# Patient Record
Sex: Male | Born: 1960 | Race: Black or African American | Hispanic: No | Smoking: Current every day smoker
Health system: Southern US, Community
[De-identification: ages and names within clinical notes are randomized; demographics above are authoritative.]

## PROBLEM LIST (undated history)

## (undated) DIAGNOSIS — N400 Enlarged prostate without lower urinary tract symptoms: Secondary | ICD-10-CM

## (undated) DIAGNOSIS — E7800 Pure hypercholesterolemia, unspecified: Secondary | ICD-10-CM

## (undated) DIAGNOSIS — I10 Essential (primary) hypertension: Secondary | ICD-10-CM

## (undated) DIAGNOSIS — N419 Inflammatory disease of prostate, unspecified: Secondary | ICD-10-CM

## (undated) DIAGNOSIS — M549 Dorsalgia, unspecified: Secondary | ICD-10-CM

## (undated) DIAGNOSIS — G4730 Sleep apnea, unspecified: Secondary | ICD-10-CM

## (undated) DIAGNOSIS — R911 Solitary pulmonary nodule: Secondary | ICD-10-CM

## (undated) DIAGNOSIS — Z8619 Personal history of other infectious and parasitic diseases: Secondary | ICD-10-CM

## (undated) DIAGNOSIS — G8929 Other chronic pain: Secondary | ICD-10-CM

## (undated) HISTORY — DX: Essential (primary) hypertension: I10

## (undated) HISTORY — PX: BUNIONECTOMY: SHX129

## (undated) HISTORY — PX: TESTICLE REMOVAL: SHX68

---

## 2003-05-26 ENCOUNTER — Emergency Department (HOSPITAL_COMMUNITY): Admission: EM | Admit: 2003-05-26 | Discharge: 2003-05-26 | Payer: Self-pay | Admitting: *Deleted

## 2003-06-02 ENCOUNTER — Emergency Department (HOSPITAL_COMMUNITY): Admission: EM | Admit: 2003-06-02 | Discharge: 2003-06-02 | Payer: Self-pay | Admitting: Emergency Medicine

## 2003-06-05 ENCOUNTER — Emergency Department (HOSPITAL_COMMUNITY): Admission: EM | Admit: 2003-06-05 | Discharge: 2003-06-06 | Payer: Self-pay | Admitting: Emergency Medicine

## 2003-07-03 ENCOUNTER — Emergency Department (HOSPITAL_COMMUNITY): Admission: EM | Admit: 2003-07-03 | Discharge: 2003-07-04 | Payer: Self-pay | Admitting: Emergency Medicine

## 2003-08-13 ENCOUNTER — Emergency Department (HOSPITAL_COMMUNITY): Admission: AD | Admit: 2003-08-13 | Discharge: 2003-08-13 | Payer: Self-pay | Admitting: Emergency Medicine

## 2003-09-07 ENCOUNTER — Emergency Department (HOSPITAL_COMMUNITY): Admission: EM | Admit: 2003-09-07 | Discharge: 2003-09-07 | Payer: Self-pay | Admitting: *Deleted

## 2003-09-19 ENCOUNTER — Emergency Department (HOSPITAL_COMMUNITY): Admission: AD | Admit: 2003-09-19 | Discharge: 2003-09-19 | Payer: Self-pay | Admitting: Family Medicine

## 2003-10-09 ENCOUNTER — Emergency Department (HOSPITAL_COMMUNITY): Admission: EM | Admit: 2003-10-09 | Discharge: 2003-10-09 | Payer: Self-pay | Admitting: Emergency Medicine

## 2003-10-12 ENCOUNTER — Emergency Department (HOSPITAL_COMMUNITY): Admission: EM | Admit: 2003-10-12 | Discharge: 2003-10-12 | Payer: Self-pay | Admitting: Emergency Medicine

## 2003-10-22 ENCOUNTER — Emergency Department (HOSPITAL_COMMUNITY): Admission: EM | Admit: 2003-10-22 | Discharge: 2003-10-22 | Payer: Self-pay | Admitting: Emergency Medicine

## 2003-10-24 ENCOUNTER — Encounter: Admission: RE | Admit: 2003-10-24 | Discharge: 2003-10-24 | Payer: Self-pay | Admitting: Internal Medicine

## 2004-01-26 ENCOUNTER — Emergency Department (HOSPITAL_COMMUNITY): Admission: EM | Admit: 2004-01-26 | Discharge: 2004-01-27 | Payer: Self-pay | Admitting: Emergency Medicine

## 2005-04-21 ENCOUNTER — Emergency Department (HOSPITAL_COMMUNITY): Admission: EM | Admit: 2005-04-21 | Discharge: 2005-04-21 | Payer: Self-pay | Admitting: Emergency Medicine

## 2005-06-17 ENCOUNTER — Emergency Department (HOSPITAL_COMMUNITY): Admission: EM | Admit: 2005-06-17 | Discharge: 2005-06-17 | Payer: Self-pay | Admitting: Emergency Medicine

## 2005-07-10 ENCOUNTER — Emergency Department (HOSPITAL_COMMUNITY): Admission: EM | Admit: 2005-07-10 | Discharge: 2005-07-11 | Payer: Self-pay | Admitting: Emergency Medicine

## 2005-08-14 ENCOUNTER — Emergency Department (HOSPITAL_COMMUNITY): Admission: EM | Admit: 2005-08-14 | Discharge: 2005-08-14 | Payer: Self-pay | Admitting: Emergency Medicine

## 2006-02-22 ENCOUNTER — Emergency Department (HOSPITAL_COMMUNITY): Admission: EM | Admit: 2006-02-22 | Discharge: 2006-02-22 | Payer: Self-pay | Admitting: Emergency Medicine

## 2006-05-03 ENCOUNTER — Emergency Department (HOSPITAL_COMMUNITY): Admission: EM | Admit: 2006-05-03 | Discharge: 2006-05-03 | Payer: Self-pay | Admitting: Emergency Medicine

## 2006-05-19 ENCOUNTER — Emergency Department (HOSPITAL_COMMUNITY): Admission: EM | Admit: 2006-05-19 | Discharge: 2006-05-19 | Payer: Self-pay | Admitting: Emergency Medicine

## 2006-10-13 ENCOUNTER — Emergency Department (HOSPITAL_COMMUNITY): Admission: EM | Admit: 2006-10-13 | Discharge: 2006-10-13 | Payer: Self-pay | Admitting: Emergency Medicine

## 2006-12-15 ENCOUNTER — Emergency Department (HOSPITAL_COMMUNITY): Admission: EM | Admit: 2006-12-15 | Discharge: 2006-12-15 | Payer: Self-pay | Admitting: Emergency Medicine

## 2006-12-15 ENCOUNTER — Encounter (INDEPENDENT_AMBULATORY_CARE_PROVIDER_SITE_OTHER): Payer: Self-pay | Admitting: *Deleted

## 2006-12-15 ENCOUNTER — Ambulatory Visit (HOSPITAL_COMMUNITY): Admission: RE | Admit: 2006-12-15 | Discharge: 2006-12-15 | Payer: Self-pay | Admitting: Emergency Medicine

## 2007-02-03 ENCOUNTER — Emergency Department (HOSPITAL_COMMUNITY): Admission: EM | Admit: 2007-02-03 | Discharge: 2007-02-03 | Payer: Self-pay | Admitting: Emergency Medicine

## 2007-02-03 ENCOUNTER — Encounter: Payer: Self-pay | Admitting: Emergency Medicine

## 2007-04-15 ENCOUNTER — Emergency Department (HOSPITAL_COMMUNITY): Admission: EM | Admit: 2007-04-15 | Discharge: 2007-04-15 | Payer: Self-pay | Admitting: Emergency Medicine

## 2007-07-25 ENCOUNTER — Emergency Department (HOSPITAL_COMMUNITY): Admission: EM | Admit: 2007-07-25 | Discharge: 2007-07-25 | Payer: Self-pay | Admitting: Emergency Medicine

## 2007-08-26 ENCOUNTER — Emergency Department (HOSPITAL_COMMUNITY): Admission: EM | Admit: 2007-08-26 | Discharge: 2007-08-26 | Payer: Self-pay | Admitting: Emergency Medicine

## 2007-09-20 ENCOUNTER — Encounter (INDEPENDENT_AMBULATORY_CARE_PROVIDER_SITE_OTHER): Payer: Self-pay | Admitting: Nurse Practitioner

## 2007-09-24 ENCOUNTER — Ambulatory Visit: Payer: Self-pay | Admitting: Nurse Practitioner

## 2007-09-24 DIAGNOSIS — M21619 Bunion of unspecified foot: Secondary | ICD-10-CM | POA: Insufficient documentation

## 2007-09-24 DIAGNOSIS — K029 Dental caries, unspecified: Secondary | ICD-10-CM | POA: Insufficient documentation

## 2007-09-24 LAB — CONVERTED CEMR LAB
ALT: 22 units/L (ref 0–53)
AST: 16 units/L (ref 0–37)
Albumin: 4.1 g/dL (ref 3.5–5.2)
Alkaline Phosphatase: 62 units/L (ref 39–117)
BUN: 15 mg/dL (ref 6–23)
Basophils Absolute: 0 10*3/uL (ref 0.0–0.1)
Basophils Relative: 0 % (ref 0–1)
Bilirubin Urine: NEGATIVE
Blood in Urine, dipstick: NEGATIVE
CO2: 22 meq/L (ref 19–32)
Calcium: 9 mg/dL (ref 8.4–10.5)
Chlamydia, Swab/Urine, PCR: NEGATIVE
Chloride: 107 meq/L (ref 96–112)
Creatinine, Ser: 0.89 mg/dL (ref 0.40–1.50)
Eosinophils Absolute: 0.4 10*3/uL (ref 0.0–0.7)
Eosinophils Relative: 5 % (ref 0–5)
GC Probe Amp, Urine: NEGATIVE
Glucose, Bld: 80 mg/dL (ref 70–99)
Glucose, Urine, Semiquant: NEGATIVE
HCT: 39.6 % (ref 39.0–52.0)
Hemoglobin: 14.4 g/dL (ref 13.0–17.0)
Ketones, urine, test strip: NEGATIVE
Lymphocytes Relative: 30 % (ref 12–46)
Lymphs Abs: 2.3 10*3/uL (ref 0.7–3.3)
MCHC: 36.4 g/dL — ABNORMAL HIGH (ref 30.0–36.0)
MCV: 76.4 fL — ABNORMAL LOW (ref 78.0–100.0)
Monocytes Absolute: 0.9 10*3/uL — ABNORMAL HIGH (ref 0.2–0.7)
Monocytes Relative: 11 % (ref 3–11)
Neutro Abs: 4.1 10*3/uL (ref 1.7–7.7)
Neutrophils Relative %: 54 % (ref 43–77)
Nitrite: NEGATIVE
PSA: 0.51 ng/mL (ref 0.10–4.00)
Platelets: 233 10*3/uL (ref 150–400)
Potassium: 4.2 meq/L (ref 3.5–5.3)
Protein, U semiquant: 30
RBC: 5.18 M/uL (ref 4.22–5.81)
RDW: 14.4 % — ABNORMAL HIGH (ref 11.5–14.0)
Sodium: 141 meq/L (ref 135–145)
Specific Gravity, Urine: 1.02
TSH: 1.011 microintl units/mL (ref 0.350–5.50)
Total Bilirubin: 0.7 mg/dL (ref 0.3–1.2)
Total Protein: 7.3 g/dL (ref 6.0–8.3)
Urobilinogen, UA: 1
WBC Urine, dipstick: NEGATIVE
WBC: 7.7 10*3/uL (ref 4.0–10.5)
pH: 7

## 2007-09-27 ENCOUNTER — Encounter (INDEPENDENT_AMBULATORY_CARE_PROVIDER_SITE_OTHER): Payer: Self-pay | Admitting: Nurse Practitioner

## 2007-09-28 ENCOUNTER — Encounter (INDEPENDENT_AMBULATORY_CARE_PROVIDER_SITE_OTHER): Payer: Self-pay | Admitting: Nurse Practitioner

## 2007-09-29 ENCOUNTER — Ambulatory Visit: Payer: Self-pay | Admitting: *Deleted

## 2007-10-06 ENCOUNTER — Telehealth (INDEPENDENT_AMBULATORY_CARE_PROVIDER_SITE_OTHER): Payer: Self-pay | Admitting: Nurse Practitioner

## 2007-10-14 ENCOUNTER — Encounter (INDEPENDENT_AMBULATORY_CARE_PROVIDER_SITE_OTHER): Payer: Self-pay | Admitting: Nurse Practitioner

## 2007-10-22 ENCOUNTER — Encounter (INDEPENDENT_AMBULATORY_CARE_PROVIDER_SITE_OTHER): Payer: Self-pay | Admitting: Nurse Practitioner

## 2007-10-22 ENCOUNTER — Ambulatory Visit (HOSPITAL_BASED_OUTPATIENT_CLINIC_OR_DEPARTMENT_OTHER): Admission: RE | Admit: 2007-10-22 | Discharge: 2007-10-22 | Payer: Self-pay | Admitting: Urology

## 2007-10-22 ENCOUNTER — Encounter (INDEPENDENT_AMBULATORY_CARE_PROVIDER_SITE_OTHER): Payer: Self-pay | Admitting: Urology

## 2007-10-22 DIAGNOSIS — Z9079 Acquired absence of other genital organ(s): Secondary | ICD-10-CM | POA: Insufficient documentation

## 2007-11-18 ENCOUNTER — Encounter (INDEPENDENT_AMBULATORY_CARE_PROVIDER_SITE_OTHER): Payer: Self-pay | Admitting: Nurse Practitioner

## 2007-12-29 ENCOUNTER — Encounter (INDEPENDENT_AMBULATORY_CARE_PROVIDER_SITE_OTHER): Payer: Self-pay | Admitting: Nurse Practitioner

## 2007-12-29 ENCOUNTER — Ambulatory Visit: Payer: Self-pay | Admitting: Internal Medicine

## 2007-12-31 ENCOUNTER — Ambulatory Visit: Payer: Self-pay | Admitting: Nurse Practitioner

## 2007-12-31 DIAGNOSIS — N489 Disorder of penis, unspecified: Secondary | ICD-10-CM | POA: Insufficient documentation

## 2008-01-10 ENCOUNTER — Telehealth (INDEPENDENT_AMBULATORY_CARE_PROVIDER_SITE_OTHER): Payer: Self-pay | Admitting: Nurse Practitioner

## 2008-01-31 ENCOUNTER — Telehealth (INDEPENDENT_AMBULATORY_CARE_PROVIDER_SITE_OTHER): Payer: Self-pay | Admitting: Nurse Practitioner

## 2008-02-23 ENCOUNTER — Ambulatory Visit: Payer: Self-pay | Admitting: Nurse Practitioner

## 2008-02-28 ENCOUNTER — Telehealth (INDEPENDENT_AMBULATORY_CARE_PROVIDER_SITE_OTHER): Payer: Self-pay | Admitting: Nurse Practitioner

## 2008-03-10 ENCOUNTER — Telehealth (INDEPENDENT_AMBULATORY_CARE_PROVIDER_SITE_OTHER): Payer: Self-pay | Admitting: Nurse Practitioner

## 2008-03-27 ENCOUNTER — Ambulatory Visit: Payer: Self-pay | Admitting: Nurse Practitioner

## 2008-03-27 DIAGNOSIS — K089 Disorder of teeth and supporting structures, unspecified: Secondary | ICD-10-CM | POA: Insufficient documentation

## 2008-03-27 DIAGNOSIS — J309 Allergic rhinitis, unspecified: Secondary | ICD-10-CM | POA: Insufficient documentation

## 2008-04-24 ENCOUNTER — Telehealth (INDEPENDENT_AMBULATORY_CARE_PROVIDER_SITE_OTHER): Payer: Self-pay | Admitting: Nurse Practitioner

## 2008-04-26 ENCOUNTER — Telehealth (INDEPENDENT_AMBULATORY_CARE_PROVIDER_SITE_OTHER): Payer: Self-pay | Admitting: Nurse Practitioner

## 2008-05-08 ENCOUNTER — Encounter (INDEPENDENT_AMBULATORY_CARE_PROVIDER_SITE_OTHER): Payer: Self-pay | Admitting: Nurse Practitioner

## 2008-05-24 ENCOUNTER — Ambulatory Visit: Payer: Self-pay | Admitting: Family Medicine

## 2008-06-21 ENCOUNTER — Ambulatory Visit: Payer: Self-pay | Admitting: Nurse Practitioner

## 2008-06-21 DIAGNOSIS — G5600 Carpal tunnel syndrome, unspecified upper limb: Secondary | ICD-10-CM | POA: Insufficient documentation

## 2008-07-16 ENCOUNTER — Emergency Department (HOSPITAL_COMMUNITY): Admission: EM | Admit: 2008-07-16 | Discharge: 2008-07-16 | Payer: Self-pay | Admitting: Emergency Medicine

## 2008-08-21 ENCOUNTER — Encounter (INDEPENDENT_AMBULATORY_CARE_PROVIDER_SITE_OTHER): Payer: Self-pay | Admitting: Nurse Practitioner

## 2008-08-21 ENCOUNTER — Ambulatory Visit: Payer: Self-pay | Admitting: Internal Medicine

## 2008-09-06 ENCOUNTER — Telehealth (INDEPENDENT_AMBULATORY_CARE_PROVIDER_SITE_OTHER): Payer: Self-pay | Admitting: Nurse Practitioner

## 2008-09-08 ENCOUNTER — Encounter (INDEPENDENT_AMBULATORY_CARE_PROVIDER_SITE_OTHER): Payer: Self-pay | Admitting: Nurse Practitioner

## 2008-09-20 ENCOUNTER — Ambulatory Visit: Payer: Self-pay | Admitting: Nurse Practitioner

## 2008-09-20 DIAGNOSIS — F17200 Nicotine dependence, unspecified, uncomplicated: Secondary | ICD-10-CM | POA: Insufficient documentation

## 2008-09-20 DIAGNOSIS — R300 Dysuria: Secondary | ICD-10-CM | POA: Insufficient documentation

## 2008-09-21 ENCOUNTER — Encounter (INDEPENDENT_AMBULATORY_CARE_PROVIDER_SITE_OTHER): Payer: Self-pay | Admitting: Nurse Practitioner

## 2008-09-22 ENCOUNTER — Encounter (INDEPENDENT_AMBULATORY_CARE_PROVIDER_SITE_OTHER): Payer: Self-pay | Admitting: Nurse Practitioner

## 2008-09-22 ENCOUNTER — Telehealth (INDEPENDENT_AMBULATORY_CARE_PROVIDER_SITE_OTHER): Payer: Self-pay | Admitting: *Deleted

## 2008-09-26 ENCOUNTER — Telehealth (INDEPENDENT_AMBULATORY_CARE_PROVIDER_SITE_OTHER): Payer: Self-pay | Admitting: *Deleted

## 2008-09-26 ENCOUNTER — Telehealth (INDEPENDENT_AMBULATORY_CARE_PROVIDER_SITE_OTHER): Payer: Self-pay | Admitting: Nurse Practitioner

## 2008-10-09 ENCOUNTER — Ambulatory Visit (HOSPITAL_BASED_OUTPATIENT_CLINIC_OR_DEPARTMENT_OTHER): Admission: RE | Admit: 2008-10-09 | Discharge: 2008-10-09 | Payer: Self-pay

## 2008-10-25 ENCOUNTER — Emergency Department (HOSPITAL_COMMUNITY): Admission: EM | Admit: 2008-10-25 | Discharge: 2008-10-25 | Payer: Self-pay | Admitting: Emergency Medicine

## 2009-02-19 ENCOUNTER — Encounter (INDEPENDENT_AMBULATORY_CARE_PROVIDER_SITE_OTHER): Payer: Self-pay | Admitting: Nurse Practitioner

## 2009-02-25 ENCOUNTER — Emergency Department (HOSPITAL_COMMUNITY): Admission: EM | Admit: 2009-02-25 | Discharge: 2009-02-25 | Payer: Self-pay | Admitting: Emergency Medicine

## 2009-02-28 ENCOUNTER — Ambulatory Visit: Payer: Self-pay | Admitting: Nurse Practitioner

## 2009-03-05 ENCOUNTER — Telehealth (INDEPENDENT_AMBULATORY_CARE_PROVIDER_SITE_OTHER): Payer: Self-pay | Admitting: Nurse Practitioner

## 2009-03-23 ENCOUNTER — Ambulatory Visit: Payer: Self-pay | Admitting: Nurse Practitioner

## 2009-03-23 DIAGNOSIS — N342 Other urethritis: Secondary | ICD-10-CM | POA: Insufficient documentation

## 2009-03-28 ENCOUNTER — Telehealth (INDEPENDENT_AMBULATORY_CARE_PROVIDER_SITE_OTHER): Payer: Self-pay | Admitting: Nurse Practitioner

## 2009-05-07 ENCOUNTER — Emergency Department (HOSPITAL_COMMUNITY): Admission: EM | Admit: 2009-05-07 | Discharge: 2009-05-07 | Payer: Self-pay | Admitting: Emergency Medicine

## 2009-05-10 ENCOUNTER — Telehealth (INDEPENDENT_AMBULATORY_CARE_PROVIDER_SITE_OTHER): Payer: Self-pay | Admitting: Nurse Practitioner

## 2009-05-11 ENCOUNTER — Ambulatory Visit: Payer: Self-pay | Admitting: Nurse Practitioner

## 2009-05-11 DIAGNOSIS — M79609 Pain in unspecified limb: Secondary | ICD-10-CM | POA: Insufficient documentation

## 2009-05-14 LAB — CONVERTED CEMR LAB: Uric Acid, Serum: 6.5 mg/dL (ref 4.0–7.8)

## 2009-05-30 ENCOUNTER — Emergency Department (HOSPITAL_COMMUNITY): Admission: EM | Admit: 2009-05-30 | Discharge: 2009-05-30 | Payer: Self-pay | Admitting: Emergency Medicine

## 2009-07-23 ENCOUNTER — Encounter (INDEPENDENT_AMBULATORY_CARE_PROVIDER_SITE_OTHER): Payer: Self-pay | Admitting: Family Medicine

## 2009-08-20 ENCOUNTER — Emergency Department (HOSPITAL_COMMUNITY): Admission: EM | Admit: 2009-08-20 | Discharge: 2009-08-20 | Payer: Self-pay | Admitting: Emergency Medicine

## 2009-08-23 ENCOUNTER — Telehealth (INDEPENDENT_AMBULATORY_CARE_PROVIDER_SITE_OTHER): Payer: Self-pay | Admitting: Nurse Practitioner

## 2009-09-19 ENCOUNTER — Ambulatory Visit: Payer: Self-pay | Admitting: Nurse Practitioner

## 2009-09-19 DIAGNOSIS — R7309 Other abnormal glucose: Secondary | ICD-10-CM | POA: Insufficient documentation

## 2009-09-19 DIAGNOSIS — N400 Enlarged prostate without lower urinary tract symptoms: Secondary | ICD-10-CM | POA: Insufficient documentation

## 2009-09-19 DIAGNOSIS — R3915 Urgency of urination: Secondary | ICD-10-CM | POA: Insufficient documentation

## 2009-09-19 LAB — CONVERTED CEMR LAB
Bilirubin Urine: NEGATIVE
Blood Glucose, Fingerstick: 90
Blood in Urine, dipstick: NEGATIVE
Glucose, Urine, Semiquant: NEGATIVE
Hgb A1c MFr Bld: 6.3 %
Ketones, urine, test strip: NEGATIVE
Nitrite: NEGATIVE
Protein, U semiquant: 100
Rapid HIV Screen: NEGATIVE
Specific Gravity, Urine: >=1.03
Urobilinogen, UA: 0.2
WBC Urine, dipstick: NEGATIVE
pH: 5

## 2009-09-25 DIAGNOSIS — R809 Proteinuria, unspecified: Secondary | ICD-10-CM | POA: Insufficient documentation

## 2009-09-25 LAB — CONVERTED CEMR LAB
ALT: 36 units/L (ref 0–53)
AST: 21 units/L (ref 0–37)
Albumin: 4.2 g/dL (ref 3.5–5.2)
Alkaline Phosphatase: 75 units/L (ref 39–117)
BUN: 18 mg/dL (ref 6–23)
Basophils Absolute: 0 10*3/uL (ref 0.0–0.1)
Basophils Relative: 0 % (ref 0–1)
CO2: 28 meq/L (ref 19–32)
Calcium: 8.8 mg/dL (ref 8.4–10.5)
Chloride: 104 meq/L (ref 96–112)
Creatinine, Ser: 1.14 mg/dL (ref 0.40–1.50)
Eosinophils Absolute: 0.1 10*3/uL (ref 0.0–0.7)
Eosinophils Relative: 1 % (ref 0–5)
Glucose, Bld: 72 mg/dL (ref 70–99)
HCT: 41.3 % (ref 39.0–52.0)
Hemoglobin: 14.4 g/dL (ref 13.0–17.0)
Lymphocytes Relative: 34 % (ref 12–46)
Lymphs Abs: 4.2 10*3/uL — ABNORMAL HIGH (ref 0.7–4.0)
MCHC: 34.9 g/dL (ref 30.0–36.0)
MCV: 77.1 fL — ABNORMAL LOW (ref 78.0–100.0)
Microalb, Ur: 12.73 mg/dL — ABNORMAL HIGH (ref 0.00–1.89)
Monocytes Absolute: 1.4 10*3/uL — ABNORMAL HIGH (ref 0.1–1.0)
Monocytes Relative: 11 % (ref 3–12)
Neutro Abs: 6.5 10*3/uL (ref 1.7–7.7)
Neutrophils Relative %: 53 % (ref 43–77)
PSA: 0.31 ng/mL (ref 0.10–4.00)
Platelets: 297 10*3/uL (ref 150–400)
Potassium: 4 meq/L (ref 3.5–5.3)
RBC: 5.36 M/uL (ref 4.22–5.81)
RDW: 15 % (ref 11.5–15.5)
Sodium: 142 meq/L (ref 135–145)
Total Bilirubin: 0.6 mg/dL (ref 0.3–1.2)
Total Protein: 7.3 g/dL (ref 6.0–8.3)
WBC: 12.2 10*3/uL — ABNORMAL HIGH (ref 4.0–10.5)

## 2009-09-26 ENCOUNTER — Encounter (INDEPENDENT_AMBULATORY_CARE_PROVIDER_SITE_OTHER): Payer: Self-pay | Admitting: Nurse Practitioner

## 2009-09-27 ENCOUNTER — Telehealth (INDEPENDENT_AMBULATORY_CARE_PROVIDER_SITE_OTHER): Payer: Self-pay | Admitting: Nurse Practitioner

## 2009-10-03 ENCOUNTER — Encounter (INDEPENDENT_AMBULATORY_CARE_PROVIDER_SITE_OTHER): Payer: Self-pay | Admitting: Nurse Practitioner

## 2009-10-19 ENCOUNTER — Encounter (INDEPENDENT_AMBULATORY_CARE_PROVIDER_SITE_OTHER): Payer: Self-pay | Admitting: Nurse Practitioner

## 2009-10-22 ENCOUNTER — Telehealth (INDEPENDENT_AMBULATORY_CARE_PROVIDER_SITE_OTHER): Payer: Self-pay | Admitting: Nurse Practitioner

## 2009-10-30 ENCOUNTER — Encounter (INDEPENDENT_AMBULATORY_CARE_PROVIDER_SITE_OTHER): Payer: Self-pay | Admitting: Nurse Practitioner

## 2009-11-22 ENCOUNTER — Telehealth (INDEPENDENT_AMBULATORY_CARE_PROVIDER_SITE_OTHER): Payer: Self-pay | Admitting: Nurse Practitioner

## 2009-12-18 ENCOUNTER — Telehealth (INDEPENDENT_AMBULATORY_CARE_PROVIDER_SITE_OTHER): Payer: Self-pay | Admitting: Nurse Practitioner

## 2010-01-22 ENCOUNTER — Telehealth (INDEPENDENT_AMBULATORY_CARE_PROVIDER_SITE_OTHER): Payer: Self-pay | Admitting: Nurse Practitioner

## 2010-02-13 ENCOUNTER — Telehealth (INDEPENDENT_AMBULATORY_CARE_PROVIDER_SITE_OTHER): Payer: Self-pay | Admitting: Nurse Practitioner

## 2010-02-19 ENCOUNTER — Telehealth (INDEPENDENT_AMBULATORY_CARE_PROVIDER_SITE_OTHER): Payer: Self-pay | Admitting: Nurse Practitioner

## 2010-04-07 ENCOUNTER — Emergency Department (HOSPITAL_COMMUNITY): Admission: EM | Admit: 2010-04-07 | Discharge: 2010-04-07 | Payer: Self-pay | Admitting: Emergency Medicine

## 2010-04-15 ENCOUNTER — Telehealth (INDEPENDENT_AMBULATORY_CARE_PROVIDER_SITE_OTHER): Payer: Self-pay | Admitting: Nurse Practitioner

## 2010-04-15 ENCOUNTER — Emergency Department (HOSPITAL_COMMUNITY): Admission: EM | Admit: 2010-04-15 | Discharge: 2010-04-15 | Payer: Self-pay | Admitting: Emergency Medicine

## 2010-04-18 ENCOUNTER — Ambulatory Visit: Payer: Self-pay | Admitting: Nurse Practitioner

## 2010-04-18 DIAGNOSIS — IMO0002 Reserved for concepts with insufficient information to code with codable children: Secondary | ICD-10-CM | POA: Insufficient documentation

## 2010-04-18 LAB — CONVERTED CEMR LAB
ALT: 30 units/L (ref 0–53)
AST: 24 units/L (ref 0–37)
Albumin: 4.4 g/dL (ref 3.5–5.2)
Alkaline Phosphatase: 84 units/L (ref 39–117)
BUN: 12 mg/dL (ref 6–23)
Basophils Absolute: 0 10*3/uL (ref 0.0–0.1)
Basophils Relative: 0 % (ref 0–1)
CO2: 25 meq/L (ref 19–32)
Calcium: 9.6 mg/dL (ref 8.4–10.5)
Chloride: 101 meq/L (ref 96–112)
Creatinine, Ser: 0.92 mg/dL (ref 0.40–1.50)
Eosinophils Absolute: 0.1 10*3/uL (ref 0.0–0.7)
Eosinophils Relative: 2 % (ref 0–5)
Glucose, Bld: 81 mg/dL (ref 70–99)
HCT: 42 % (ref 39.0–52.0)
Hemoglobin: 14.8 g/dL (ref 13.0–17.0)
Lymphocytes Relative: 33 % (ref 12–46)
Lymphs Abs: 2.5 10*3/uL (ref 0.7–4.0)
MCHC: 35.2 g/dL (ref 30.0–36.0)
MCV: 76.6 fL — ABNORMAL LOW (ref 78.0–100.0)
Monocytes Absolute: 0.7 10*3/uL (ref 0.1–1.0)
Monocytes Relative: 9 % (ref 3–12)
Neutro Abs: 4.2 10*3/uL (ref 1.7–7.7)
Neutrophils Relative %: 56 % (ref 43–77)
Platelets: 253 10*3/uL (ref 150–400)
Potassium: 4.5 meq/L (ref 3.5–5.3)
RBC: 5.48 M/uL (ref 4.22–5.81)
RDW: 14.7 % (ref 11.5–15.5)
Sed Rate: 17 mm/hr — ABNORMAL HIGH (ref 0–16)
Sodium: 138 meq/L (ref 135–145)
Total Bilirubin: 0.7 mg/dL (ref 0.3–1.2)
Total Protein: 7.8 g/dL (ref 6.0–8.3)
Uric Acid, Serum: 6.8 mg/dL (ref 4.0–7.8)
WBC: 7.5 10*3/uL (ref 4.0–10.5)

## 2010-04-19 ENCOUNTER — Ambulatory Visit (HOSPITAL_COMMUNITY): Admission: RE | Admit: 2010-04-19 | Discharge: 2010-04-19 | Payer: Self-pay | Admitting: Internal Medicine

## 2010-04-26 ENCOUNTER — Encounter (INDEPENDENT_AMBULATORY_CARE_PROVIDER_SITE_OTHER): Payer: Self-pay | Admitting: *Deleted

## 2010-04-26 ENCOUNTER — Telehealth (INDEPENDENT_AMBULATORY_CARE_PROVIDER_SITE_OTHER): Payer: Self-pay | Admitting: Nurse Practitioner

## 2010-05-02 ENCOUNTER — Telehealth (INDEPENDENT_AMBULATORY_CARE_PROVIDER_SITE_OTHER): Payer: Self-pay | Admitting: Nurse Practitioner

## 2010-06-05 ENCOUNTER — Emergency Department (HOSPITAL_COMMUNITY): Admission: EM | Admit: 2010-06-05 | Discharge: 2010-06-05 | Payer: Self-pay | Admitting: Emergency Medicine

## 2010-07-22 ENCOUNTER — Telehealth (INDEPENDENT_AMBULATORY_CARE_PROVIDER_SITE_OTHER): Payer: Self-pay | Admitting: Internal Medicine

## 2010-08-03 ENCOUNTER — Emergency Department (HOSPITAL_COMMUNITY)
Admission: EM | Admit: 2010-08-03 | Discharge: 2010-08-03 | Payer: Self-pay | Source: Home / Self Care | Admitting: Emergency Medicine

## 2010-09-20 ENCOUNTER — Telehealth (INDEPENDENT_AMBULATORY_CARE_PROVIDER_SITE_OTHER): Payer: Self-pay | Admitting: Nurse Practitioner

## 2010-10-22 ENCOUNTER — Telehealth (INDEPENDENT_AMBULATORY_CARE_PROVIDER_SITE_OTHER): Payer: Self-pay | Admitting: Internal Medicine

## 2010-11-21 ENCOUNTER — Encounter (INDEPENDENT_AMBULATORY_CARE_PROVIDER_SITE_OTHER): Payer: Self-pay | Admitting: Nurse Practitioner

## 2010-11-21 ENCOUNTER — Ambulatory Visit: Payer: Self-pay | Admitting: Nurse Practitioner

## 2010-11-21 LAB — CONVERTED CEMR LAB
Amphetamine Screen, Ur: NEGATIVE
Barbiturate Quant, Ur: NEGATIVE
Benzodiazepines.: NEGATIVE
Cocaine Metabolites: NEGATIVE
Creatinine,U: 351.9 mg/dL
Marijuana Metabolite: NEGATIVE
Methadone: NEGATIVE
Opiate Screen, Urine: NEGATIVE
Phencyclidine (PCP): NEGATIVE
Propoxyphene: NEGATIVE

## 2010-11-22 ENCOUNTER — Encounter (INDEPENDENT_AMBULATORY_CARE_PROVIDER_SITE_OTHER): Payer: Self-pay | Admitting: Nurse Practitioner

## 2010-12-23 ENCOUNTER — Telehealth (INDEPENDENT_AMBULATORY_CARE_PROVIDER_SITE_OTHER): Payer: Self-pay | Admitting: Nurse Practitioner

## 2010-12-26 ENCOUNTER — Encounter (INDEPENDENT_AMBULATORY_CARE_PROVIDER_SITE_OTHER): Payer: Self-pay | Admitting: Nurse Practitioner

## 2010-12-26 ENCOUNTER — Telehealth (INDEPENDENT_AMBULATORY_CARE_PROVIDER_SITE_OTHER): Payer: Self-pay | Admitting: Nurse Practitioner

## 2011-01-05 LAB — CONVERTED CEMR LAB
ALT: 26 units/L (ref 0–53)
AST: 21 units/L (ref 0–37)
Albumin: 4.3 g/dL (ref 3.5–5.2)
Alkaline Phosphatase: 78 units/L (ref 39–117)
BUN: 20 mg/dL (ref 6–23)
Basophils Absolute: 0 10*3/uL (ref 0.0–0.1)
Basophils Relative: 0 % (ref 0–1)
Bilirubin Urine: NEGATIVE
CO2: 23 meq/L (ref 19–32)
Calcium: 9.6 mg/dL (ref 8.4–10.5)
Chlamydia, Swab/Urine, PCR: NEGATIVE
Chloride: 100 meq/L (ref 96–112)
Creatinine, Ser: 1.15 mg/dL (ref 0.40–1.50)
Eosinophils Absolute: 0.2 10*3/uL (ref 0.0–0.7)
Eosinophils Relative: 2 % (ref 0–5)
GC Probe Amp, Urine: NEGATIVE
Glucose, Bld: 44 mg/dL — ABNORMAL LOW (ref 70–99)
Glucose, Urine, Semiquant: NEGATIVE
HCT: 42.3 % (ref 39.0–52.0)
Hemoglobin: 15 g/dL (ref 13.0–17.0)
Ketones, urine, test strip: NEGATIVE
Lymphocytes Relative: 31 % (ref 12–46)
Lymphs Abs: 2.7 10*3/uL (ref 0.7–4.0)
MCHC: 35.5 g/dL (ref 30.0–36.0)
MCV: 77.8 fL — ABNORMAL LOW (ref 78.0–100.0)
Monocytes Absolute: 1.3 10*3/uL — ABNORMAL HIGH (ref 0.1–1.0)
Monocytes Relative: 14 % — ABNORMAL HIGH (ref 3–12)
Neutro Abs: 4.6 10*3/uL (ref 1.7–7.7)
Neutrophils Relative %: 53 % (ref 43–77)
Nitrite: NEGATIVE
PSA: 0.51 ng/mL (ref 0.10–4.00)
Platelets: 289 10*3/uL (ref 150–400)
Potassium: 4.5 meq/L (ref 3.5–5.3)
Protein, U semiquant: 30
RBC: 5.44 M/uL (ref 4.22–5.81)
RDW: 14.3 % (ref 11.5–15.5)
Sodium: 139 meq/L (ref 135–145)
Specific Gravity, Urine: 1.015
TSH: 0.859 microintl units/mL (ref 0.350–4.50)
Total Bilirubin: 0.7 mg/dL (ref 0.3–1.2)
Total Protein: 7.8 g/dL (ref 6.0–8.3)
Urobilinogen, UA: 1
WBC Urine, dipstick: NEGATIVE
WBC: 8.7 10*3/uL (ref 4.0–10.5)
pH: 7.5

## 2011-01-07 NOTE — Progress Notes (Signed)
Summary: REFILL ON VICODEN  Phone Note Call from Patient   Reason for Call: Refill Medication Summary of Call: Douglas Glass PT. Douglas Glass CALLED AND SAYS THAT HE IS STILL IN [place] AND NEEDS ANOTHER REFILL IN HIS HYDROCODONE AND [facility] [place]. Initial call taken by: Leodis Rains,  January 22, 2010 8:46 AM  Follow-up for Phone Call        will forward to provider for review.......(thought provider was not refilling anymore).............Marland KitchenMikey College CMA  January 22, 2010 9:41 AM   Additional Follow-up for Phone Call Additional follow up Details #1::        Rx printed and in basket. - fax to [place] - see previous notes for fax # Advise pt that this is his 3rd and FINAL Rx for pain medication.  I was willing to accomodate his temporary move as he noted that it was to see relatives during the holiday It appears that he has relocated and he will need to find a new provider in his new location.  No further pain medications will be prescribed.  If he needs his records sent to an office in which he establishes in [place] then inform medical records. Additional Follow-up by: Lehman Prom FNP,  January 22, 2010 9:54 AM    Additional Follow-up for Phone Call Additional follow up Details #2::    pt informed. Follow-up by: Levon Hedger,  January 22, 2010 12:46 PM  Prescriptions: VICODIN ES 7.5-750 MG  TABS (HYDROCODONE-ACETAMINOPHEN) 1 tablet by mouth two times a day as needed for pain  #60 x 0   Entered and Authorized by:   Lehman Prom FNP   Signed by:   Lehman Prom FNP on 01/22/2010   Method used:   Printed then faxed to ...       [facility] [address] [phone]* (retail)       [address]       [place], [place]  [address]       Ph: [phone] or [phone]       Fax: [phone]   RxID:   [id number]

## 2011-01-07 NOTE — Progress Notes (Signed)
Summary: Hydrocodone  Phone Note Call from Patient   Reason for Call: Refill Medication Summary of Call: NEEDS HYDROCODONE FAXED TO [facility]  RD. Initial call taken by: Arta Bruce,  February 19, 2010 8:58 AM  Follow-up for Phone Call        forward to N. Daphine Deutscher, FNP Follow-up by: Levon Hedger,  February 19, 2010 9:12 AM  Additional Follow-up for Phone Call Additional follow up Details #1::        Rx in basket  fax to pharmacy  notify pt Additional Follow-up by: Lehman Prom FNP,  February 19, 2010 12:48 PM    Additional Follow-up for Phone Call Additional follow up Details #2::    pt informed.  [facility] Follow-up by: Levon Hedger,  February 19, 2010 2:21 PM  Prescriptions: VICODIN ES 7.5-750 MG  TABS (HYDROCODONE-ACETAMINOPHEN) 1 tablet by mouth two times a day as needed for pain  #60 x 0   Entered and Authorized by:   Lehman Prom FNP   Signed by:   Lehman Prom FNP on 02/19/2010   Method used:   Printed then faxed to ...       [facility] [address] [phone]* (retail)       [address]       [place], [place]  [address]       Ph: [phone] or [phone]       Fax: [phone]   RxID:   [id number] VICODIN ES 7.5-750 MG  TABS (HYDROCODONE-ACETAMINOPHEN) 1 tablet by mouth two times a day as needed for pain  #60 x 0   Entered and Authorized by:   Lehman Prom FNP   Signed by:   Lehman Prom FNP on 02/19/2010   Method used:   Printed then faxed to ...       [facility]  [address] [phone]* (retail)       [address]       [place], [place]  [address]       Ph: [phone] or [phone]       Fax: [phone]   RxID:   [id number]

## 2011-01-07 NOTE — Progress Notes (Signed)
Summary: Med refill  Phone Note Call from Patient Call back at [facility] Phone [phone]   Summary of Call: The pt needs more refills from his bp medication and the flomax medication cost too much for him ($90.00).  Also he wants to request for his vicodin which due by 15 or 16 of this month.  [facility] [place] [facility]   [facility] .  The address of the pharmacy is [address] [place] [address]. [phone]. [facility] FNP Initial call taken by: Manon Hilding,  December 18, 2009 2:49 PM  Follow-up for Phone Call        forward to provider Follow-up by: Armenia Shannon,  December 18, 2009 2:56 PM  Additional Follow-up for Phone Call Additional follow up Details #1::        ??initial note - pt is NOT on any BP medication. Flomax is expensive which is why he was getting it from [facility] pharmacy as they were ordering it from a pt assistance program.  There is nothing I can do to help him get this if he is out of state. Vicodin due 12/23/2009 but will fill on 1/14/;2011.  Rx in basket. Fax to requested pharmacy. Additional Follow-up by: Lehman Prom FNP,  December 19, 2009 9:34 AM    Additional Follow-up for Phone Call Additional follow up Details #2::    SPPOKE WITH PATIENT AND TOLD HIM WE WOULD FAX THE SCRIPT///KT  Follow-up by: Leodis Rains,  December 21, 2009 9:18 AM  Additional Follow-up for Phone Call Additional follow up Details #3:: Details for Additional Follow-up Action Taken: Rx faxed to # [phone] [facility] [place]. Additional Follow-up by: Levon Hedger,  December 25, 2009 3:29 PM  Prescriptions: VICODIN ES 7.5-750 MG  TABS (HYDROCODONE-ACETAMINOPHEN) 1 tablet by mouth two times a day as needed for pain  #60 x 0   Entered and Authorized by:   Lehman Prom FNP   Signed by:   Lehman Prom FNP on 12/21/2009   Method used:   Printed then faxed to ...       [facility]  [address] [phone]* (retail)       [address]       [place], [place]   [address]       Ph: [phone] or [phone]       Fax: [phone]   RxID:   [id number]

## 2011-01-07 NOTE — Letter (Signed)
Summary: *HSN Results Follow up  [facility]  [address] [place], [place] [address]   Phone: [phone]  Fax: [phone]      04/26/2010   [facility] [address] [place], [place]  [address]   Dear  Mr. Colbert Diss,                            ____S.Drinkard,FNP   ____D. Gore,FNP       ____B. McPherson,MD   ____V. Rankins,MD    ____E. Mulberry,MD    __X__N. Daphine Deutscher, FNP  ____D. Reche Dixon, MD    ____K. Philipp Deputy, MD    ____Other     This letter is to inform you that your recent test(s):  _______Pap Smear    _______Lab Test     ____X___X-ray    _______ is within acceptable limits  _______ requires a medication change  _______ requires a follow-up lab visit  _______ requires a follow-up visit with your provider   Comments: We have been unable to reach you by phone. Please contact us to review your MRI results.       _________________________________________________________ If you have any questions, please contact our office                     Sincerely,  Gaylyn Cheers RN [facility]

## 2011-01-07 NOTE — Letter (Signed)
Summary: *HSN Results Follow up  [facility]  [address] [place], [place] [address]   Phone: [phone]  Fax: [phone]      04/26/2010   [facility] [address] [place], [place]  [address]   Dear  [name],                            ____S.Drinkard,FNP   ____D. Gore,FNP       ____B. McPherson,MD   ____V. Rankins,MD    ____E. Mulberry,MD    ____N. Daphine Deutscher, FNP  ____D. Reche Dixon, MD    ____K. Philipp Deputy, MD    ____Other     This letter is to inform you that your recent test(s):  _______Pap Smear    _______Lab Test     _______X-ray    _______ is within acceptable limits  _______ requires a medication change  _______ requires a follow-up lab visit  _______ requires a follow-up visit with your provider   Comments:       _________________________________________________________ If you have any questions, please contact our office                     Sincerely,  Gaylyn Cheers RN [facility]

## 2011-01-07 NOTE — Progress Notes (Signed)
Summary: vicodin refill  Phone Note Call from Patient   Summary of Call: Pt requesting for his monthly vicodin be [facility] Initial call taken by: Vesta Mixer CMA,  July 22, 2010 2:20 PM  Follow-up for Phone Call        forward to N. Daphine Deutscher, fnp last filled 06/20/2010 Follow-up by: Levon Hedger,  July 22, 2010 2:27 PM    Prescriptions: VICODIN ES 7.5-750 MG  TABS (HYDROCODONE-ACETAMINOPHEN) 1 tablet by mouth two times a day as needed for pain  #60 x 0   Entered and Authorized by:   Julieanne Manson MD   Signed by:   Julieanne Manson MD on 07/22/2010   Method used:   Printed then faxed to ...       [facility]  [address] [phone]* (retail)       [address]       [place], [place]  [address]       Ph: [phone] or [phone]       Fax: [phone]   RxID:   [id number] VICODIN ES 7.5-750 MG  TABS (HYDROCODONE-ACETAMINOPHEN) 1 tablet by mouth two times a day as needed for pain  #60 x 0   Entered by:   Julieanne Manson MD   Authorized by:   Lehman Prom FNP   Signed by:   Julieanne Manson MD on 07/22/2010   Method used:   Printed then faxed to ...       [facility]  [address] [phone]* (retail)       [address]       [place], [place]  [address]       Ph: [phone] or [phone]       Fax: [phone]   RxID:   [id number]  printed with wrong provider initially

## 2011-01-07 NOTE — Assessment & Plan Note (Signed)
Summary: Left knee pain   Vital Signs:  Patient profile:   50 year old male Weight:      226.9 pounds Temp:     98.4 degrees F oral Pulse rate:   66 / minute Pulse rhythm:   regular Resp:     16 per minute BP sitting:   151 / 91  (left arm) Cuff size:   large  Vitals Entered By: Levon Hedger (Apr 18, 2010 10:15 AM) CC: left knee pain, swollen and it feels like it popped , right toe is still numb from the surgery Is Patient Diabetic? No Pain Assessment Patient in pain? yes     Location: leg  Does patient need assistance? Functional Status Self care Ambulation Normal   CC:  left knee pain, swollen and it feels like it popped , and right toe is still numb from the surgery.  History of Present Illness:  Pt into the office for follow up.  Left knee - Was bending down to "read" his putt and he heard his knee pop. Following that he started with swelling in the left knee. He has been to the ER twice; first he was given ibuprofen and a knee brace.   Pt admits that he has the brace but he does not wear it as ordered.  X-rays done which were negative (per pt report) He then returned to the ER with continued swelling and pain.  Pt was requesting "removal of fluid" but request was denied.  Prior to that knee was dislocating and he was able to pop back in location.  Allergies (verified): No Known Drug Allergies  Review of Systems CV:  Denies chest pain or discomfort. Resp:  Denies cough. GI:  Denies abdominal pain, nausea, and vomiting. MS:  Complains of joint pain; left knee. Neuro:  Complains of tingling; still with tingling and numbness in the right great toe  ongoing since his surgery.  Physical Exam  General:  alert.   Head:  normocephalic.   Lungs:  normal breath sounds.   Heart:  normal rate and regular rhythm.     Knee Exam  Knee Exam:    Left:    Inspection:  Normal    Palpation:  Abnormal       Location:  medial capsule    Stability:  stable  Tenderness:  medial collateral    Swelling:  no    Erythema:  no   Impression & Recommendations:  Problem # 1:  KNEE PAIN, LEFT (ICD-719.46) will order MRI of left knee advised pt to wear left support ibuprofen as needed for pain His updated medication list for this problem includes:    Vicodin Es 7.5-750 Mg Tabs (Hydrocodone-acetaminophen) .[name]... 1 tablet by mouth two times a day as needed for pain    Meloxicam 15 Mg Tabs (Meloxicam) .[name]... 1 tablet by mouth daily for inflammation  Orders: MRI with Contrast (MRI w/Contrast) T-Comprehensive Metabolic Panel (82956-21308) T-CBC w/Diff (65784-69629) TLB-Uric Acid, Blood (84550-URIC) TLB-Sedimentation Rate (ESR) (85652-ESR)  Complete Medication List: 1)  Vicodin Es 7.5-750 Mg Tabs (Hydrocodone-acetaminophen) .[name].. 1 tablet by mouth two times a day as needed for pain 2)  Nasacort Aq 55 Mcg/act Aers (Triamcinolone acetonide(nasal)) .[name].. 1 spray in each nostril two times a day 3)  Allegra 180 Mg Tabs (Fexofenadine hcl) .[name].. 1 tablet by mouth daily for allergies 4)  Lyrica 50 Mg Caps (Pregabalin) .[name].. 1 capsule by mouth three times a day 5)  Meloxicam 15 Mg Tabs (Meloxicam) .[name].. 1 tablet  by mouth daily for inflammation 6)  Flomax 0.4 Mg Caps (Tamsulosin hcl) .... One capsule by mouth daily for prostate  Patient Instructions: 1)  You will be scheduled for a MRI of your left knee. 2)  This will be done at [facility]. 3)  You will be notified of the results. 4)  You need to wear knee support during the day and remove at night.  Continue to take the ibuprofen as needed for swelling. Prescriptions: VICODIN ES 7.5-750 MG  TABS (HYDROCODONE-ACETAMINOPHEN) 1 tablet by mouth two times a day as needed for pain  #60 x 0   Entered and Authorized by:   Lehman Prom FNP   Signed by:   Lehman Prom FNP on 04/18/2010   Method used:   Print then Give to Patient   RxID:   [id number]   Appended Document: Left knee pain    Clinical Lists  Changes  Orders: Added new Test order of MRI with & without Contrast (MRI w&w/o Contrast) - Signed

## 2011-01-07 NOTE — Progress Notes (Signed)
Summary: MED REFILL  Phone Note Call from Patient   Summary of Call: PT CAME BY OFFICE TO GET RX FOR VICODIN. LAST RX WRITTEN ON 9/15. Initial call taken by: Hassell Halim CMA,  September 20, 2010 8:07 AM  Follow-up for Phone Call        Rx printed. Pt here to pick up. Follow-up by: Lehman Prom FNP,  September 20, 2010 9:12 AM    Prescriptions: VICODIN ES 7.5-750 MG  TABS (HYDROCODONE-ACETAMINOPHEN) 1 tablet by mouth two times a day as needed for pain  #60 x 0   Entered and Authorized by:   Lehman Prom FNP   Signed by:   Lehman Prom FNP on 09/20/2010   Method used:   Print then Give to Patient   RxID:   [id number]

## 2011-01-07 NOTE — Progress Notes (Signed)
Summary: [facility] ortho referral  Phone Note Call from Patient   Summary of Call: pt informed of information of MRI result 5/20 and would like to have referral sent to [facility] for ortho clinic consideration.  He is aware that Stanton Kidney from other sight does our referral and as soon as we get date and time of appt from ortho we will contact him with that information. Initial call taken by: Levon Hedger,  May 02, 2010 3:24 PM  Follow-up for Phone Call        will forward to [facility] for referral - ortho Follow-up by: Lehman Prom FNP,  May 02, 2010 4:03 PM  New Problems: MENISCUS TEAR (ICD-836.2)   New Problems: MENISCUS TEAR (ICD-836.2)

## 2011-01-07 NOTE — Progress Notes (Signed)
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 mild sprain of the anterior cruciate ligament without overt tear - this is a ligament in the front part of the knee.  it is sprained but not torn People recover differently from these injuries.  Of note would be to stop any activities by which he puts direct pressure on the knee such as with bending, stooping, etc He needs orthopedic referral which will be difficult due to cost.  [facility] charge $250 co-payment which i know is not financially feasible for pt at this time.  Another option would be to see if [facility] will see him in their orthopedic clinic.  That choice will be up to the pt. Will continue on the same pain regimen (no change/increase) in pain meds, rest knee when he can, elevate. Initial call taken by: Lehman Prom FNP,  Apr 26, 2010 7:55 AM  Follow-up for Phone Call        [phone] No longer patients number, [phone] no longer his #, [phone]. No other #'s available. Will mail letter Gaylyn Cheers RN  Apr 26, 2010 3:49 PM   Additional Follow-up for Phone Call Additional follow up Details #1::        noted.  will ask staff to send leter requesting pt to call for results Clinical staff to review if pt calls Additional Follow-up by: Lehman Prom FNP,  Apr 26, 2010 4:33 PM

## 2011-01-07 NOTE — Progress Notes (Signed)
Summary: (ACUTE) LEG SWOLLEN  Phone Note Call from Patient Call back at Home Phone [phone]   Summary of Call: Douglas PT. Douglas Glass WENT TO [place] LAST WEEK BECAUSE OF HIS LEFT KNEE TO LEFT LEG IS SWOLLEN, HE IS SCHEDULED TO SEE YOU THIS THURSDAY, BUT HE NEEDS TO BE SEEN SOONER, BECAUSE HE IS IN  ALOT OF PAIN AND IT IS BIGGER THAN THE OTHER LEG. Initial call taken by: Leodis Rains,  Apr 15, 2010 11:44 AM  Follow-up for Phone Call        spoke with pt and he says his left knee and leg is swollen feels like something is stretching, he thinks it is fluid and it is very painful. He say he can barely walk and is on his way to [facility] to see if they can remove some of the fluid that he is feeling. Follow-up by: Levon Hedger,  Apr 15, 2010 12:15 PM  Additional Follow-up for Phone Call Additional follow up Details #1::        If pt is going to the ER then no need for an acute slot. no slots available today or he can keep the appt on Thursday  Additional Follow-up by: Lehman Prom FNP,  Apr 15, 2010 12:21 PM

## 2011-01-07 NOTE — Progress Notes (Signed)
Summary: MEDS REFILL  Phone Note Refill Request   Refills Requested: Medication #1:  VICODIN ES 7.5-750 MG  TABS 1 tablet by mouth two times a day as needed for pain [facility]  Initial call taken by: Domenic Polite,  October 22, 2010 2:21 PM  Follow-up for Phone Call        Pt. has called several times for this to be filled. Follow-up by: Dutch Quint RN,  October 22, 2010 4:50 PM    Prescriptions: VICODIN ES 7.5-750 MG  TABS (HYDROCODONE-ACETAMINOPHEN) 1 tablet by mouth two times a day as needed for pain  #60 x 0   Entered and Authorized by:   Julieanne Manson MD   Signed by:   Julieanne Manson MD on 10/22/2010   Method used:   Printed then faxed to ...       [facility]  [address] [phone]* (retail)       [address]       [place], [place]  [address]       Ph: [phone] or [phone]       Fax: [phone]   RxID:   [id number]

## 2011-01-07 NOTE — Progress Notes (Signed)
Summary: REFILL ON HIS [facility]  Phone Note Call from Patient Call back at Home Phone [phone]   Reason for Call: Refill Medication Summary of Call: MARTIN PT. MR [facility] CALLED AND HE IS BACK IN [place]OWN. HE SAYS THAT HE NEEDS [place] TO CALL IN HIS FLOMAX TO [place] [facility]. HE COULDN'T AFFORD TO GET IN IN [place]. Initial call taken by: Leodis Rains,  February 13, 2010 9:59 AM  Follow-up for Phone Call        PT CAME HERE REQUESTING FOR REFILLS FROM [facility] MEDICATION.  Manon Hilding  February 13, 2010 1:59 PM  Levon Hedger  February 13, 2010 3:48 PM Forward to N. Daphine Deutscher, fnp  Additional Follow-up for Phone Call Additional follow up Details #1::        Pt should have refills at [facility] pharmacy he can request them from the pharmacy and pick them up when ready Additional Follow-up by: Lehman Prom FNP,  February 13, 2010 6:59 PM    Additional Follow-up for Phone Call Additional follow up Details #2::    pt informed. Follow-up by: Levon Hedger,  February 14, 2010 3:47 PM

## 2011-01-09 NOTE — Letter (Signed)
Summary: DENTAL REFERRAL  DENTAL REFERRAL   Imported By: Arta Bruce 12/30/2010 14:02:37  _____________________________________________________________________  External Attachment:    Type:   Image     Comment:   External Document

## 2011-01-09 NOTE — Progress Notes (Signed)
Summary: ASING FOR DENTAL REF  Phone Note Call from Patient Call back at Home Phone [phone]   Reason for Call: Referral Summary of Call: [facility] pt. Douglas Glass says he called the Dental clinic and was told that we need to send a referral because he called and told them he wants his teeth cleaned and he was told by them for [place] to send them a ref. Initial call taken by: Leodis Rains,  December 27, 2010 12:00 pm  Follow-up for Phone Call        dental referral done notify pt that it will be faxed and they will contact him directly with time/date of the appt Follow-up by: Lehman Prom FNP,  December 27, 2010 8:50 AM  Additional Follow-up for Phone Call Additional follow up Details #1::        pt informed of above information. Additional Follow-up by: Levon Hedger,  December 27, 2010 2:46 PM

## 2011-01-09 NOTE — Progress Notes (Signed)
Summary: Vicodin Refill  Phone Note Refill Request   Refills Requested: Medication #1:  VICODIN ES 7.5-750 MG  TABS 1 tablet by mouth two times a day as needed for pain [facility]  Initial call taken by: Armenia Shannon,  December 23, 2010 2:57 PM  Follow-up for Phone Call        Provider called rx to pharmacy on Monday notify pt to check with pharmacy to pick up  Follow-up by: Lehman Prom FNP,  December 23, 2010 5:44 PM  Additional Follow-up for Phone Call Additional follow up Details #1::        pt notified rx completed and sent to pharmacy.. Additional Follow-up by: Hassell Halim CMA,  December 24, 2010 8:47 AM    Prescriptions: VICODIN ES 7.5-750 MG  TABS (HYDROCODONE-ACETAMINOPHEN) 1 tablet by mouth two times a day as needed for pain  #60 x 0   Entered and Authorized by:   Lehman Prom FNP   Signed by:   Lehman Prom FNP on 12/23/2010   Method used:   Telephoned to ...       [facility]  [address] [phone]* (retail)       [address]       [place], [place]  [address]       Ph: [phone] or [phone]       Fax: [phone]   RxID:   [id number]

## 2011-01-09 NOTE — Letter (Signed)
Summary: *HSN Results Follow up  [facility]  [address] [place], [place] [address]   Phone: [phone]  Fax: [phone]      11/22/2010   [facility] [address] [place], [place]  [address]   Dear  [name],                            ____S.Drinkard,FNP   ____D. Gore,FNP       ____B. McPherson,MD   ____V. Rankins,MD    ____E. Mulberry,MD    _X___N. Daphine Deutscher, FNP  ____D. Reche Dixon, MD    ____K. Philipp Deputy, MD    ____Other     This letter is to inform you that your recent test(s):  _______Pap Smear    __X_____Lab Test     _______X-ray    ___X___ is within acceptable limits  _______ requires a medication change  _______ requires a follow-up lab visit  _______ requires a follow-up visit with your provider   Comments:  Labs done during your recent office visit are normal.  Thank you for being compliant with your pain contract.       _________________________________________________________ If you have any questions, please contact our office [phone].                    Sincerely,    Lehman Prom FNP [facility]

## 2011-01-22 ENCOUNTER — Emergency Department (HOSPITAL_COMMUNITY): Payer: Self-pay

## 2011-01-22 ENCOUNTER — Emergency Department (HOSPITAL_COMMUNITY)
Admission: EM | Admit: 2011-01-22 | Discharge: 2011-01-22 | Disposition: A | Discharge status: Home / Self Care | Payer: Self-pay | Attending: Emergency Medicine | Admitting: Emergency Medicine

## 2011-01-22 DIAGNOSIS — L298 Other pruritus: Secondary | ICD-10-CM | POA: Insufficient documentation

## 2011-01-22 DIAGNOSIS — L2989 Other pruritus: Secondary | ICD-10-CM | POA: Insufficient documentation

## 2011-02-20 ENCOUNTER — Telehealth (INDEPENDENT_AMBULATORY_CARE_PROVIDER_SITE_OTHER): Payer: Self-pay | Admitting: Nurse Practitioner

## 2011-02-25 NOTE — Progress Notes (Signed)
Summary: Hydrocodone refill  Phone Note Refill Request   Refills Requested: Medication #1:  VICODIN ES 7.5-750 MG  TABS 1 tablet by mouth two times a day as needed for pain Initial call taken by: Nicholaus Bloom,  February 20, 2011 11:37 AM  Follow-up for Phone Call        will fill on 02/21/2011 - Friday (tomorrow) and fax to the pharmacy Follow-up by: Lehman Prom FNP,  February 20, 2011 12:08 PM  Additional Follow-up for Phone Call Additional follow up Details #1::        Pt. called back --  Advised of provider's response, states his medication is due today and he has to go out of town and is waiting for it to be filled. Very insistent that it's due today.  Dutch Quint RN  February 20, 2011 3:32 PM     Additional Follow-up for Phone Call Additional follow up Details #2::    If pt wants to be insistant remind him that February was a short month so TECHNICALLY he should have some extras.  Meds will be due on 03/24/2011 - not before then on next month Rx printed and faxed to [facility]   **Addendum**  pt is here to pick up rx. given to shiela to given to pt n.martin,fnp  February 21, 2011  8:22 AM   Follow-up by: Lehman Prom FNP,  February 21, 2011 8:17 AM  Prescriptions: VICODIN ES 7.5-750 MG  TABS (HYDROCODONE-ACETAMINOPHEN) 1 tablet by mouth two times a day as needed for pain  #60 x 0   Entered and Authorized by:   Lehman Prom FNP   Signed by:   Lehman Prom FNP on 02/21/2011   Method used:   Printed then faxed to ...       [facility] [phone]* (retail)       [address]       [place], [place]  [address]       Ph: [phone] or [phone]       Fax: [phone]   RxID:   [id number] VICODIN ES 7.5-750 MG  TABS (HYDROCODONE-ACETAMINOPHEN) 1 tablet by mouth two times a day as needed for pain  #60 x 0   Entered and Authorized by:   Lehman Prom FNP   Signed by:   Lehman Prom FNP on 02/21/2011   Method used:   Printed then faxed to ...    [facility] [phone][facility] (retail)       [address]       [place], [place]  [address]       Ph: [phone] or [phone]       Fax: [phone]   RxID:   [id number]

## 2011-03-18 LAB — GLUCOSE, CAPILLARY: Glucose-Capillary: 101 mg/dL — ABNORMAL HIGH (ref 70–99)

## 2011-03-20 LAB — URINE CULTURE
Colony Count: NO GROWTH
Culture: NO GROWTH

## 2011-03-20 LAB — URINALYSIS, ROUTINE W REFLEX MICROSCOPIC
Glucose, UA: NEGATIVE mg/dL
Hgb urine dipstick: NEGATIVE
Ketones, ur: 15 mg/dL — AB
Leukocytes, UA: NEGATIVE
Nitrite: NEGATIVE
Protein, ur: 100 mg/dL — AB
Specific Gravity, Urine: 1.033 — ABNORMAL HIGH (ref 1.005–1.030)
Urobilinogen, UA: 1 mg/dL (ref 0.0–1.0)
pH: 5.5 (ref 5.0–8.0)

## 2011-03-20 LAB — URINE MICROSCOPIC-ADD ON

## 2011-03-20 LAB — GC/CHLAMYDIA PROBE AMP, GENITAL
Chlamydia, DNA Probe: NEGATIVE
GC Probe Amp, Genital: NEGATIVE

## 2011-03-29 ENCOUNTER — Emergency Department (HOSPITAL_COMMUNITY): Payer: Self-pay

## 2011-03-29 ENCOUNTER — Emergency Department (HOSPITAL_COMMUNITY)
Admission: EM | Admit: 2011-03-29 | Discharge: 2011-03-29 | Disposition: A | Discharge status: Home / Self Care | Payer: Self-pay | Attending: Emergency Medicine | Admitting: Emergency Medicine

## 2011-03-29 DIAGNOSIS — D573 Sickle-cell trait: Secondary | ICD-10-CM | POA: Insufficient documentation

## 2011-03-29 DIAGNOSIS — M79609 Pain in unspecified limb: Secondary | ICD-10-CM | POA: Insufficient documentation

## 2011-04-09 ENCOUNTER — Emergency Department (HOSPITAL_COMMUNITY)
Admission: EM | Admit: 2011-04-09 | Discharge: 2011-04-09 | Disposition: A | Discharge status: Home / Self Care | Payer: Self-pay | Attending: Emergency Medicine | Admitting: Emergency Medicine

## 2011-04-09 DIAGNOSIS — M79609 Pain in unspecified limb: Secondary | ICD-10-CM | POA: Insufficient documentation

## 2011-04-09 DIAGNOSIS — D573 Sickle-cell trait: Secondary | ICD-10-CM | POA: Insufficient documentation

## 2011-04-22 NOTE — Op Note (Signed)
Douglas Glass, Douglas Glass               ACCOUNT NO.:  [id number]   MEDICAL RECORD NO.:  [id number]          PATIENT TYPE:  AMB   LOCATION:  [facility]                         FACILITY:  [facility]   PHYSICIAN:  Lindaann Slough, M.D.  DATE OF BIRTH:  February 19, 1961   DATE OF PROCEDURE:  10/22/2007  DATE OF DISCHARGE:                               OPERATIVE REPORT   PREOPERATIVE DIAGNOSIS:  Chronic left orchialgia.   POSTOPERATIVE DIAGNOSIS:  Chronic left orchialgia.   PROCEDURE:  Left scrotal orchiectomy.   SURGEON:  Danae Chen, M.D.   ANESTHESIA:  General.   DATE OF PROCEDURE:  October 22, 2007.   INDICATIONS FOR PROCEDURE:  The patient is a 50 year old male who was  seen in the office on October 14, 2007, complaining of severe left  testicular pain.  He has been having pain for about 4 years now.  He was  then treated with doxycycline and Vicodin for testicular pain and he had  not seen any improvement.  The patient wants to have the testicle out so  that he could stop taking pain medication.  I spent a great deal of time  with the patient discussing about an orchiectomy.  I told him that once  the orchiectomy is done and the incision is healed I will not give him  any more pain medication.  He also understands that the testicle is  normal and the only reason we are taking the testicle out is for pain.  He states that he understands and he has been having pain for several  years and that the scrotum was enlarged at one time.  I discussed this  again with him this morning in the presence of the nurses and he states  that he wants the testicle out.  The procedure, the risks and benefits  were discussed in detail.  He understands and would like to proceed.  Informed consent was given.   PROCEDURE IN DETAIL:  Under general anesthesia the patient was prepped  and draped and placed in the supine position.  The scrotum was  infiltrated with 0.25% Marcaine and a longitudinal incision was made on  the scrotum.  The incision was carried down to the tunica vaginalis  which was then incised.  The testicle was then delivered through the  wound.  The gubernaculum was dissected from the scrotum and ligated with  #0 Vicryl and cut in-between ligatures.  The tail of the epididymis was  firm and swollen.  The vas was then identified and was found to be  somewhat relatively large.  It was dissected from the rest of the cord  and ligated with #0 Vicryl and cut in-between ligatures.  The rest of  the cord was then divided in two segments and each segment was doubly  ligated with #0 Vicryl and suture ligated with #0 Vicryl and the cord  was then transected.  The testicle and cord were then sent to  pathology.  The wound was then irrigated with normal saline.  Hemostasis  was completed with electrocautery.  The scrotum was then closed in two  layers with #  3-0 Vicryl.  The patient tolerated the procedure well and  left the OR in satisfactory condition to post anesthesia care unit.      Lindaann Slough, M.D.  Electronically Signed     MN/MEDQ  D:  10/22/2007  T:  10/23/2007  Job:  [id number]   cc:   Melvern Banker  Fax: [phone]

## 2011-04-22 NOTE — Op Note (Signed)
NAMELEVONTE, MOLINA               ACCOUNT NO.:  [id number]   MEDICAL RECORD NO.:  [id number]          PATIENT TYPE:  AMB   LOCATION:  [facility]                          FACILITY:  [facility]   PHYSICIAN:  Alvan Dame, D.P.M. DATE OF BIRTH:  08/19/1961   DATE OF PROCEDURE:  10/09/2008  DATE OF DISCHARGE:                               OPERATIVE REPORT   SURGEON:  Alvan Dame, DPM   PREOPERATIVE DIAGNOSIS:  Hallux abductovalgus deformity, bilateral.   POSTOPERATIVE DIAGNOSIS:  Hallux abductovalgus deformity, bilateral.   OPERATIVE PROCEDURE:  Austin bunionectomy pin fixation, bilateral.   INDICATIONS FOR SURGERY:  The patient has had a greater than 1-year  history of painful bunion deformity with bursitis, first MTP area.  Pain  with walking activities and include shoe wear.  The patient does have  history of arthropathy, previously on tramadol and Feldene and he has  been trying to avoid pressure on the joints of the foot.  X-rays  continue to show increased timing 12-14 degrees bilateral, deviation of  sesamoid positions 5-6 bilateral with some cystic changes of first  metatarsal head, asymmetric joint space narrowing bilateral,  inflammatory changes noted clinically and radiographically bilateral.  Based on clinical and radiographic findings, surgical procedure is  scheduled.   ANESTHESIA:  General anesthesia with local anesthetic administered total  of 10 mL of 0.5% Marcaine plain to each foot in a Mayo block fashion  bilateral.   FINDINGS AND PROCEDURES:  The patient was brought to OR and placed on  the table in supine position.  General anesthesia was established and  local anesthetic was administered as indicated.  The left foot was  exsanguinated with the Esmarch wrap and ankle tourniquet deflated to 250  mmHg and the following procedure was then carried out.   Procedure #1:  Eliberto Ivory bunionectomy, left foot.  Attention was directed  to the left foot where approximately 6-7 cm  curvilinear incision was  made just medial to and paralleling the long extensor tendon.  The  incision was deepened via sharp dissection to the level of capsular  structures.  An inverted L capsular incision was made.  Medial  collateral ligaments were freed and the head of the first metatarsal was  delivered into the operative field.  Utilizing power instrumentation,  the hypertrophied dorsal and medial elements of the first metatarsal  were resected and flushed with the shaft.  Attention was now directed to  the first intermetatarsal space where the conjoined tendon and the  adductor was identified.  A 1-cm section was removed at this time with  the fibular sesamoid identified and freed distally, proximally, and  laterally.  Upon freeing them, the phalanx was noted to be in a more  rectus position.  At this time, attention was redirected to the first  metatarsal head where a through-and-through V osteotomy was carried out  in a standard osteotomy fashion.  The capital fragments were shifted  laterally, impacted, and fixated with a single 0.045 K-wire in a  standard fashion.  Fluoroscopy was utilized to ascertain position of the  fixation and osteotomy placement.  Once fixated,  the redundant medial  shelf was resected and flushed with the shaft.  All roughened edges were  smoothened.  The site was lavaged with copious amounts of sterile  antibiotic solution and cleared of all soft tissue and osseous debris.  At this time, closure was accomplished as follows.  The capsule was  reapproximated using 3-0 Monocryl in a continuous running fashion.  Subcu tissue was reapproximated using 4-0 Monocryl and skin  reapproximated using 5-0 Monocryl in a subcuticular fashion.  Betadine,  Adaptic, and a dry sterile dressing was applied to the left foot.  Ankle  tourniquet deflated with immediate interval perfusion to all toes being  noted.   Procedure #2:  Eliberto Ivory bunionectomy, right foot.  Exact same  procedure as  performed on the left foot was repeated in an identical fashion with  identical osteotomy procedure, fixation, and skin closure was being  carried out.  It should be noted that on completion of each procedure,  each foot was infiltrated with 1/2 mL of dexamethasone phosphate 10  mg/mL and sterile dressings were being applied.   On completion of both procedures, the patient was returned from the OR  to the recovery in satisfactory condition and discharged with following  postop instructions.  Prescriptions for pain and antibiotic medication  and an appointment for followup in the office has been made.  The  patient will be monitored in the [facility] offices for postop  care for the next 2-3 months.           ______________________________  Alvan Dame, D.P.M.     RS/MEDQ  D:  10/10/2008  T:  10/11/2008  Job:  [id number]

## 2011-05-05 ENCOUNTER — Emergency Department (HOSPITAL_COMMUNITY)
Admission: EM | Admit: 2011-05-05 | Discharge: 2011-05-05 | Disposition: A | Discharge status: Home / Self Care | Payer: Self-pay | Attending: Emergency Medicine | Admitting: Emergency Medicine

## 2011-05-05 DIAGNOSIS — M109 Gout, unspecified: Secondary | ICD-10-CM | POA: Insufficient documentation

## 2011-05-05 DIAGNOSIS — M79609 Pain in unspecified limb: Secondary | ICD-10-CM | POA: Insufficient documentation

## 2011-05-05 DIAGNOSIS — D573 Sickle-cell trait: Secondary | ICD-10-CM | POA: Insufficient documentation

## 2011-07-03 ENCOUNTER — Emergency Department (HOSPITAL_COMMUNITY)
Admission: EM | Admit: 2011-07-03 | Discharge: 2011-07-03 | Disposition: A | Discharge status: Home / Self Care | Payer: Self-pay | Attending: Emergency Medicine | Admitting: Emergency Medicine

## 2011-07-03 ENCOUNTER — Emergency Department (HOSPITAL_COMMUNITY): Payer: Self-pay

## 2011-07-03 DIAGNOSIS — M25569 Pain in unspecified knee: Secondary | ICD-10-CM | POA: Insufficient documentation

## 2011-07-03 DIAGNOSIS — M25469 Effusion, unspecified knee: Secondary | ICD-10-CM | POA: Insufficient documentation

## 2011-07-03 DIAGNOSIS — D573 Sickle-cell trait: Secondary | ICD-10-CM | POA: Insufficient documentation

## 2011-08-15 ENCOUNTER — Emergency Department (HOSPITAL_COMMUNITY): Payer: Self-pay

## 2011-08-15 ENCOUNTER — Emergency Department (HOSPITAL_COMMUNITY)
Admission: EM | Admit: 2011-08-15 | Discharge: 2011-08-16 | Disposition: A | Discharge status: Home / Self Care | Payer: Self-pay | Attending: Emergency Medicine | Admitting: Emergency Medicine

## 2011-08-15 DIAGNOSIS — J189 Pneumonia, unspecified organism: Secondary | ICD-10-CM | POA: Insufficient documentation

## 2011-08-15 DIAGNOSIS — R0609 Other forms of dyspnea: Secondary | ICD-10-CM | POA: Insufficient documentation

## 2011-08-15 DIAGNOSIS — D573 Sickle-cell trait: Secondary | ICD-10-CM | POA: Insufficient documentation

## 2011-08-15 DIAGNOSIS — R079 Chest pain, unspecified: Secondary | ICD-10-CM | POA: Insufficient documentation

## 2011-08-15 DIAGNOSIS — R0989 Other specified symptoms and signs involving the circulatory and respiratory systems: Secondary | ICD-10-CM | POA: Insufficient documentation

## 2011-08-15 DIAGNOSIS — R062 Wheezing: Secondary | ICD-10-CM | POA: Insufficient documentation

## 2011-08-15 LAB — HEPATIC FUNCTION PANEL
Albumin: 3.6 g/dL (ref 3.5–5.2)
Total Protein: 7.5 g/dL (ref 6.0–8.3)

## 2011-08-15 LAB — CK TOTAL AND CKMB (NOT AT ARMC)
CK, MB: 3.5 ng/mL (ref 0.3–4.0)
Total CK: 530 U/L — ABNORMAL HIGH (ref 7–232)

## 2011-08-15 LAB — BASIC METABOLIC PANEL
Chloride: 100 mEq/L (ref 96–112)
GFR calc Af Amer: >60 mL/min (ref >60)
Potassium: 3.2 mEq/L — ABNORMAL LOW (ref 3.5–5.1)
Sodium: 139 mEq/L (ref 135–145)

## 2011-08-15 LAB — CBC
HCT: 37.2 % — ABNORMAL LOW (ref 39.0–52.0)
Platelets: 235 10*3/uL (ref 150–400)
RDW: 14.6 % (ref 11.5–15.5)
WBC: 9.8 10*3/uL (ref 4.0–10.5)

## 2011-08-15 LAB — TROPONIN I: Troponin I: <0.3 ng/mL (ref <0.30)

## 2011-08-15 LAB — POCT I-STAT TROPONIN I

## 2011-08-15 LAB — PROTIME-INR
INR: 1.02 (ref 0.00–1.49)
Prothrombin Time: 13.6 seconds (ref 11.6–15.2)

## 2011-08-16 LAB — LIPID PANEL: Cholesterol: 227 mg/dL — ABNORMAL HIGH (ref 0–200)

## 2011-08-16 LAB — POCT I-STAT TROPONIN I: Troponin i, poc: 0.02 ng/mL (ref 0.00–0.08)

## 2011-09-09 LAB — POCT HEMOGLOBIN-HEMACUE: Hemoglobin: 15.2

## 2011-09-16 LAB — POCT HEMOGLOBIN-HEMACUE: Hemoglobin: 13.5

## 2011-09-18 LAB — URINALYSIS, ROUTINE W REFLEX MICROSCOPIC
Bilirubin Urine: NEGATIVE
Glucose, UA: NEGATIVE
Ketones, ur: NEGATIVE
Nitrite: NEGATIVE
Protein, ur: NEGATIVE

## 2011-09-19 LAB — URINALYSIS, ROUTINE W REFLEX MICROSCOPIC
Bilirubin Urine: NEGATIVE
Hgb urine dipstick: NEGATIVE
Ketones, ur: NEGATIVE
Nitrite: NEGATIVE
pH: 7

## 2011-09-19 LAB — URINE CULTURE: Colony Count: NO GROWTH

## 2011-12-24 ENCOUNTER — Encounter (HOSPITAL_COMMUNITY): Payer: Self-pay | Admitting: *Deleted

## 2011-12-24 ENCOUNTER — Emergency Department (HOSPITAL_COMMUNITY)
Admission: EM | Admit: 2011-12-24 | Discharge: 2011-12-25 | Disposition: A | Discharge status: Home / Self Care | Payer: Self-pay | Attending: Emergency Medicine | Admitting: Emergency Medicine

## 2011-12-24 DIAGNOSIS — R300 Dysuria: Secondary | ICD-10-CM | POA: Insufficient documentation

## 2011-12-24 DIAGNOSIS — N419 Inflammatory disease of prostate, unspecified: Secondary | ICD-10-CM | POA: Insufficient documentation

## 2011-12-24 DIAGNOSIS — Z9079 Acquired absence of other genital organ(s): Secondary | ICD-10-CM | POA: Insufficient documentation

## 2011-12-24 HISTORY — DX: Pure hypercholesterolemia, unspecified: E78.00

## 2011-12-24 LAB — URINALYSIS, ROUTINE W REFLEX MICROSCOPIC
Glucose, UA: NEGATIVE mg/dL
Ketones, ur: NEGATIVE mg/dL
Leukocytes, UA: NEGATIVE
Nitrite: NEGATIVE
Protein, ur: NEGATIVE mg/dL
Urobilinogen, UA: 0.2 mg/dL (ref 0.0–1.0)

## 2011-12-24 LAB — URINE MICROSCOPIC-ADD ON

## 2011-12-24 MED ORDER — OXYCODONE-ACETAMINOPHEN 5-325 MG PO TABS
2.0000 | ORAL_TABLET | Freq: Once | ORAL | Status: AC
  Administered 2011-12-24: 2 via ORAL
  Filled 2011-12-24: qty 2

## 2011-12-24 MED ORDER — DOXYCYCLINE HYCLATE 100 MG PO TABS
100.0000 mg | ORAL_TABLET | Freq: Once | ORAL | Status: AC
  Administered 2011-12-24: 100 mg via ORAL

## 2011-12-24 MED ORDER — OXYCODONE-ACETAMINOPHEN 5-325 MG PO TABS: 1.0000 | ORAL_TABLET | ORAL | Status: AC | PRN

## 2011-12-24 MED ORDER — DOXYCYCLINE HYCLATE 100 MG PO TABS
ORAL_TABLET | ORAL | Status: AC
  Administered 2011-12-24: 100 mg via ORAL
  Filled 2011-12-24: qty 1

## 2011-12-24 MED ORDER — DOXYCYCLINE HYCLATE 100 MG PO CAPS: 100.0000 mg | ORAL_CAPSULE | Freq: Two times a day (BID) | ORAL | Status: AC

## 2011-12-24 NOTE — ED Provider Notes (Signed)
History     CSN: [id number]  Arrival date & time 12/24/11  1901   None     Chief Complaint  Patient presents with  . Generalized Body Aches    pt c/o aches to legs, back and chest that began 2 weeks ago.   . Dysuria    pt also c/o pain with urination. states pain started "a couple days ago." denies penile discharge.     (Consider location/radiation/quality/duration/timing/severity/associated sxs/prior treatment) HPI Douglas Glass is a 51 y.o. male presents with c/o dysuria leading to desire to be assessed in the ED. The sx(s) have been present for 3 days. Additional concerns are low back pain and myalgias. Causative factors are nothing. Palliative factors are nothing. The distress associated is moderate. The disorder has been present for 3 days.   Past Medical History  Diagnosis Date  . Hypercholesteremia     Past Surgical History  Procedure Date  . Testicle removal   . Bunionectomy     History reviewed. No pertinent family history.  History  Substance Use Topics  . Smoking status: Current Everyday Smoker    Types: Cigarettes  . Smokeless tobacco: Not on file  . Alcohol Use: No      Review of Systems  All other systems reviewed and are negative.    Allergies  Review of patient's allergies indicates no known allergies.  Home Medications   Current Outpatient Rx  Name Route Sig Dispense Refill  . DOXYCYCLINE HYCLATE 100 MG PO CAPS Oral Take 1 capsule (100 mg total) by mouth 2 (two) times daily. 28 capsule 0  . OXYCODONE-ACETAMINOPHEN 5-325 MG PO TABS Oral Take 1 tablet by mouth every 4 (four) hours as needed for pain. 20 tablet 0    BP 154/99  Pulse 72  Temp(Src) 98.6 F (37 C) (Oral)  Resp 20  SpO2 98%  Physical Exam  Constitutional: He appears well-developed and well-nourished.  HENT:  Head: Normocephalic.  Eyes: EOM are normal. Pupils are equal, round, and reactive to light.  Neck: Normal range of motion. Neck supple.  Cardiovascular: Normal  rate.   Pulmonary/Chest: Effort normal.  Abdominal: Soft. He exhibits no distension. There is no tenderness.  Genitourinary: Penis normal. No penile tenderness.       No urethral d/c or groin abnormality. Solitary testicle- no swelling or tenderness  Musculoskeletal: Normal range of motion.    ED Course  Procedures (including critical care time)  Labs Reviewed  URINALYSIS, ROUTINE W REFLEX MICROSCOPIC - Abnormal; Notable for the following:    Hgb urine dipstick SMALL (*)    All other components within normal limits  URINE MICROSCOPIC-ADD ON   No results found.   1. Prostatitis       MDM  Urinary frequency, with nondiagnostic urinalysis; associated low back pain, making prostatitis highly likely.        Flint Melter, MD 12/25/11 [phone]

## 2012-01-05 ENCOUNTER — Other Ambulatory Visit: Payer: Self-pay

## 2012-01-05 ENCOUNTER — Observation Stay (HOSPITAL_COMMUNITY)
Admission: EM | Admit: 2012-01-05 | Discharge: 2012-01-07 | Disposition: A | Discharge status: Home / Self Care | Payer: Self-pay | Attending: Internal Medicine | Admitting: Internal Medicine

## 2012-01-05 ENCOUNTER — Encounter (HOSPITAL_COMMUNITY): Payer: Self-pay | Admitting: *Deleted

## 2012-01-05 DIAGNOSIS — R0789 Other chest pain: Secondary | ICD-10-CM | POA: Insufficient documentation

## 2012-01-05 DIAGNOSIS — M79609 Pain in unspecified limb: Secondary | ICD-10-CM

## 2012-01-05 DIAGNOSIS — F17200 Nicotine dependence, unspecified, uncomplicated: Secondary | ICD-10-CM | POA: Insufficient documentation

## 2012-01-05 DIAGNOSIS — R911 Solitary pulmonary nodule: Secondary | ICD-10-CM | POA: Insufficient documentation

## 2012-01-05 DIAGNOSIS — E7800 Pure hypercholesterolemia, unspecified: Secondary | ICD-10-CM | POA: Insufficient documentation

## 2012-01-05 DIAGNOSIS — R079 Chest pain, unspecified: Principal | ICD-10-CM | POA: Diagnosis present

## 2012-01-05 NOTE — ED Notes (Signed)
The pt is c/o chest pain for 2-3 weeks with some sob

## 2012-01-06 ENCOUNTER — Emergency Department (HOSPITAL_COMMUNITY): Payer: Self-pay

## 2012-01-06 DIAGNOSIS — R079 Chest pain, unspecified: Secondary | ICD-10-CM | POA: Diagnosis present

## 2012-01-06 LAB — HEPATIC FUNCTION PANEL
ALT: 25 U/L (ref 0–53)
AST: 17 U/L (ref 0–37)
Alkaline Phosphatase: 81 U/L (ref 39–117)
Bilirubin, Direct: <0.1 mg/dL (ref 0.0–0.3)
Total Bilirubin: 0.5 mg/dL (ref 0.3–1.2)

## 2012-01-06 LAB — CBC
HCT: 36.8 % — ABNORMAL LOW (ref 39.0–52.0)
HCT: 39 % (ref 39.0–52.0)
Hemoglobin: 13.3 g/dL (ref 13.0–17.0)
MCH: 26.8 pg (ref 26.0–34.0)
MCHC: 34.1 g/dL (ref 30.0–36.0)
MCV: 74.6 fL — ABNORMAL LOW (ref 78.0–100.0)
MCV: 74.7 fL — ABNORMAL LOW (ref 78.0–100.0)
Platelets: 254 10*3/uL (ref 150–400)
RBC: 4.93 MIL/uL (ref 4.22–5.81)
RDW: 14.7 % (ref 11.5–15.5)
RDW: 14.8 % (ref 11.5–15.5)

## 2012-01-06 LAB — DIFFERENTIAL
Eosinophils Absolute: 0.2 10*3/uL (ref 0.0–0.7)
Eosinophils Relative: 2 % (ref 0–5)
Lymphs Abs: 2.4 10*3/uL (ref 0.7–4.0)
Monocytes Absolute: 1.4 10*3/uL — ABNORMAL HIGH (ref 0.1–1.0)

## 2012-01-06 LAB — CARDIAC PANEL(CRET KIN+CKTOT+MB+TROPI)
CK, MB: 2.4 ng/mL (ref 0.3–4.0)
CK, MB: 2.4 ng/mL (ref 0.3–4.0)
Relative Index: 0.9 (ref 0.0–2.5)
Troponin I: <0.3 ng/mL (ref <0.30)

## 2012-01-06 LAB — BASIC METABOLIC PANEL
Calcium: 9.6 mg/dL (ref 8.4–10.5)
Creatinine, Ser: 1.07 mg/dL (ref 0.50–1.35)
GFR calc non Af Amer: 79 mL/min — ABNORMAL LOW (ref >90)
Glucose, Bld: 134 mg/dL — ABNORMAL HIGH (ref 70–99)
Sodium: 138 mEq/L (ref 135–145)

## 2012-01-06 LAB — POCT I-STAT TROPONIN I: Troponin i, poc: 0 ng/mL (ref 0.00–0.08)

## 2012-01-06 LAB — CREATININE, SERUM: GFR calc non Af Amer: >90 mL/min (ref >90)

## 2012-01-06 LAB — LIPASE, BLOOD: Lipase: 19 U/L (ref 11–59)

## 2012-01-06 LAB — D-DIMER, QUANTITATIVE: D-Dimer, Quant: 0.53 ug/mL-FEU — ABNORMAL HIGH (ref 0.00–0.48)

## 2012-01-06 MED ORDER — ONDANSETRON HCL 4 MG/2ML IJ SOLN: 4.0000 mg | Freq: Three times a day (TID) | INTRAMUSCULAR | Status: DC | PRN

## 2012-01-06 MED ORDER — ONDANSETRON HCL 4 MG/2ML IJ SOLN: 4.0000 mg | Freq: Four times a day (QID) | INTRAMUSCULAR | Status: DC | PRN

## 2012-01-06 MED ORDER — PANTOPRAZOLE SODIUM 40 MG PO TBEC
40.0000 mg | DELAYED_RELEASE_TABLET | Freq: Every day | ORAL | Status: DC
  Administered 2012-01-06 – 2012-01-07 (×2): 40 mg via ORAL
  Filled 2012-01-06 (×2): qty 1

## 2012-01-06 MED ORDER — IOHEXOL 300 MG/ML  SOLN
100.0000 mL | Freq: Once | INTRAMUSCULAR | Status: AC | PRN
  Administered 2012-01-06: 100 mL via INTRAVENOUS

## 2012-01-06 MED ORDER — ASPIRIN 81 MG PO CHEW
81.0000 mg | CHEWABLE_TABLET | Freq: Every day | ORAL | Status: DC
  Administered 2012-01-06 – 2012-01-07 (×2): 81 mg via ORAL
  Filled 2012-01-06 (×2): qty 1

## 2012-01-06 MED ORDER — ALUM & MAG HYDROXIDE-SIMETH 200-200-20 MG/5ML PO SUSP: 30.0000 mL | Freq: Four times a day (QID) | ORAL | Status: DC | PRN

## 2012-01-06 MED ORDER — ENOXAPARIN SODIUM 40 MG/0.4ML ~~LOC~~ SOLN
40.0000 mg | SUBCUTANEOUS | Status: DC
  Administered 2012-01-06: 40 mg via SUBCUTANEOUS
  Filled 2012-01-06 (×2): qty 0.4

## 2012-01-06 MED ORDER — MORPHINE SULFATE 4 MG/ML IJ SOLN
2.0000 mg | INTRAMUSCULAR | Status: DC | PRN
  Administered 2012-01-07: 2 mg via INTRAVENOUS
  Administered 2012-01-07: 1 mg via INTRAVENOUS
  Filled 2012-01-06 (×3): qty 1

## 2012-01-06 MED ORDER — ASPIRIN 81 MG PO CHEW
324.0000 mg | CHEWABLE_TABLET | Freq: Once | ORAL | Status: AC
  Administered 2012-01-06: 324 mg via ORAL
  Filled 2012-01-06: qty 4

## 2012-01-06 MED ORDER — ACETAMINOPHEN 650 MG RE SUPP: 650.0000 mg | Freq: Four times a day (QID) | RECTAL | Status: DC | PRN

## 2012-01-06 MED ORDER — ONDANSETRON HCL 4 MG PO TABS: 4.0000 mg | ORAL_TABLET | Freq: Four times a day (QID) | ORAL | Status: DC | PRN

## 2012-01-06 MED ORDER — ACETAMINOPHEN 325 MG PO TABS: 650.0000 mg | ORAL_TABLET | Freq: Four times a day (QID) | ORAL | Status: DC | PRN

## 2012-01-06 MED ORDER — GI COCKTAIL ~~LOC~~
30.0000 mL | Freq: Once | ORAL | Status: AC
  Administered 2012-01-06: 30 mL via ORAL
  Filled 2012-01-06: qty 30

## 2012-01-06 MED ORDER — MORPHINE SULFATE 4 MG/ML IJ SOLN
4.0000 mg | Freq: Once | INTRAMUSCULAR | Status: AC
  Administered 2012-01-06: 4 mg via INTRAVENOUS
  Filled 2012-01-06: qty 1

## 2012-01-06 NOTE — ED Notes (Signed)
Spoke with echo concerning pt's dobutamine stress test. Transporter en route to take pt for study. Delay explained to pt.

## 2012-01-06 NOTE — ED Provider Notes (Signed)
See prior note   Ward Givens, MD 01/06/12 1550

## 2012-01-06 NOTE — ED Notes (Signed)
Awaiting echo results.

## 2012-01-06 NOTE — ED Notes (Addendum)
Received report from [facility]. Currently pt is resting in bed.Per pt, he came to the ED because he was having crushing CP.  No CP or respiratory distress at this time. Will continue to monitor.

## 2012-01-06 NOTE — Plan of Care (Signed)
Problem: Consults Goal: Tobacco Cessation referral if indicated Outcome: Completed/Met Date Met:  01/06/12 smoker

## 2012-01-06 NOTE — ED Provider Notes (Addendum)
History     CSN: [id number]  Arrival date & time 01/05/12  2331   First MD Initiated Contact with Patient 01/06/12 0009      Chief Complaint  Patient presents with  . Chest Pain    (Consider location/radiation/quality/duration/timing/severity/associated sxs/prior treatment) HPI  50yoM history of hypertension, hypercholesterolemia, smoker presents with chest pain. The patient complaining of chest pain that "I can't describe". He states he is having sudden onset of chest pain shortness of breath several times a day for the past 2-3 weeks. There no exacerbating factors. He states that when he kneels and presses his chest against the side of the bed sometimes the symptoms began to get better. He does complain of pleurisy. He denies fevers, chills, cough. Chronic back pain at baseline. He states that he did have radiation of the pain to his bilateral upper extremities  ED Notes, ED Provider Notes from 01/05/12 0000 to 01/05/12 23:43:08       Wynona Canes Chrisco, RN 01/05/2012 23:42      The pt is c/o chest pain for 2-3 weeks with some sob     Past Medical History  Diagnosis Date  . Hypercholesteremia     Past Surgical History  Procedure Date  . Testicle removal   . Bunionectomy     History reviewed. No pertinent family history.  History  Substance Use Topics  . Smoking status: Current Everyday Smoker    Types: Cigarettes  . Smokeless tobacco: Not on file  . Alcohol Use: No    Review of Systems  All other systems reviewed and are negative.  except as noted HPI   Allergies  Review of patient's allergies indicates no known allergies.  Home Medications  No current outpatient prescriptions on file.  BP 157/97  Pulse 72  Temp(Src) 97.9 F (36.6 C) (Oral)  Resp 22  SpO2 99%  Physical Exam  Nursing note and vitals reviewed. Constitutional: He is oriented to person, place, and time. He appears well-developed and well-nourished. No distress.       He is uncomfortable,  sinking down into the bed during exam  HENT:  Head: Atraumatic.  Mouth/Throat: Oropharynx is clear and moist.  Eyes: Conjunctivae are normal. Pupils are equal, round, and reactive to light.  Neck: Neck supple.  Cardiovascular: Normal rate, regular rhythm, normal heart sounds and intact distal pulses.  Exam reveals no gallop and no friction rub.   No murmur heard. Pulmonary/Chest: Effort normal. No respiratory distress. He has no wheezes. He has no rales. He exhibits no tenderness.  Abdominal: Soft. Bowel sounds are normal. There is no tenderness. There is no rebound and no guarding.  Musculoskeletal: Normal range of motion. He exhibits no edema and no tenderness.  Neurological: He is alert and oriented to person, place, and time.  Skin: Skin is warm and dry.  Psychiatric: He has a normal mood and affect.    Date: 01/06/2012  Rate: 78  Rhythm: normal sinus rhythm  QRS Axis: normal  Intervals: normal  ST/T Wave abnormalities: nonspecific T wave changes  Conduction Disutrbances:none  Narrative Interpretation:   Old EKG Reviewed: unchanged  ED Course  Procedures (including critical care time)  Labs Reviewed  CBC - Abnormal; Notable for the following:    HCT 36.8 (*)    MCV 74.6 (*)    All other components within normal limits  DIFFERENTIAL - Abnormal; Notable for the following:    Monocytes Relative 15 (*)    Monocytes Absolute 1.4 (*)  All other components within normal limits  BASIC METABOLIC PANEL - Abnormal; Notable for the following:    Glucose, Bld 134 (*)    GFR calc non Af Amer 79 (*)    All other components within normal limits  CARDIAC PANEL(CRET KIN+CKTOT+MB+TROPI) - Abnormal; Notable for the following:    Total CK 330 (*)    All other components within normal limits  D-DIMER, QUANTITATIVE - Abnormal; Notable for the following:    D-Dimer, Quant 0.53 (*)    All other components within normal limits   Dg Chest 2 View  01/06/2012  *RADIOLOGY REPORT*  Clinical  Data: Chest pain and shortness of breath for two or 3 weeks.  CHEST - 2 VIEW  Comparison: 08/15/2011  Findings: Shallow inspiration.  Heart size and pulmonary vascularity are normal.  No focal airspace consolidation in the lungs.  No blunting of costophrenic angles.  No pneumothorax.  Old left rib fracture.  No significant change since previous study.  IMPRESSION: No evidence of active pulmonary disease.  Original Report Authenticated By: Marlon Pel, M.D.   Ct Angio Chest W/cm &/or Wo Cm  01/06/2012  *RADIOLOGY REPORT*  Clinical Data: Chest pain and shortness of breath for 2-3 weeks.  CT ANGIOGRAPHY CHEST  Technique:  Multidetector CT imaging of the chest using the standard protocol during bolus administration of intravenous contrast. Multiplanar reconstructed images including MIPs were obtained and reviewed to evaluate the vascular anatomy.  Contrast: OMNIPAQUE IOHEXOL 300 MG/ML IV SOLN  Comparison: None.  Findings: Technically adequate study with good opacification of the central and segmental pulmonary arteries.  No focal filling defects demonstrated.  No evidence of significant pulmonary embolus. Normal caliber thoracic aorta.  Calcification of coronary arteries. Normal heart size.  No significant lymphadenopathy in the chest. Respiratory motion artifact limits visualization of the lung fields but there is no evidence of focal airspace consolidation or interstitial disease.  Mild dependent atelectasis. 4 mm nodule in the right middle lung is indeterminate but likely benign in a low risk patient.  If the patient is at high risk for bronchogenic carcinoma, follow-up chest CT at 1 year is recommended.  If the patient is at low risk, no follow-up is needed.  This recommendation follows the consensus statement: Guidelines for Management of Small Pulmonary Nodules Detected on CT Scans:  A Statement from the Fleischner Society as published in Radiology 2005; 237:395-400.  Available online at:  [email or web address].cz. No pleural effusion.  No pneumothorax.  Normal alignment of the thoracic vertebrae.  IMPRESSION: No evidence of significant pulmonary embolus.  4 mm indeterminate nodule in the right middle lung.  See above recommendations.  Original Report Authenticated By: Marlon Pel, M.D.    1. Atypical chest pain   2. Lung nodule    MDM   The patient presents with atypical cp/sob. No previous w/u for same. EKG unremarkable. CX unremarkable. Elevated d dimer with CT negative for PE. +Nodule as described above. Cardiac enzymes negative. He has been given ASA and one dose of morphine in the ED and is now resting comfortably. The patient is a TIMI 0 but does have risk factors for CAD including HTN, HLD. Will hold in CDU for stress test in AM.         Forbes Cellar, MD 01/06/12 4098  Forbes Cellar, MD 01/06/12 1191

## 2012-01-06 NOTE — ED Notes (Signed)
Pt remains in echolab. Pt had one episode of cp that resolved <35min. Pt is currently pain free and testing continues.

## 2012-01-06 NOTE — ED Notes (Signed)
Pt in CT. Escorted wife with pt's belongings to CDU#8. Phoned CT. Pt will be transported to CDU#8 after scan.

## 2012-01-06 NOTE — H&P (Signed)
PCP:   Sheila Oats, MD, MD   Chief Complaint:  Chest pain worsened yesterday.   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 Chest pain was substernal, occurs without any relation to activity, intermittent, aching time of pain, stays for 5 to 7 minutes and resolved spontaneously, also describes the pain radiating to both arms. Has occasional nausea, no vomiting, no fever ,chills, or sob. No syncope, palpitations. Pt was in ED since yesterday under chest pain protocol. His enzymes have been negative, so far. EKG does not show any ST T wave changes. He went for stress test this morning, but the results were inconclusive, as patient was not able to reach maximum heart rate. We were requested to admit the patient for lexiview in am by [facility] ([place]   Review of Systems:  The patient denies anorexia, fever, weight loss,, vision loss, decreased hearing, hoarsenesssyncope, dyspnea on exertion, peripheral edema, balance deficits, hemoptysis, abdominal pain, melena, hematochezia, severe indigestion/heartburn, hematuria, incontinence, genital sores, muscle weakness, suspicious skin lesions, transient blindness, difficulty walking, depression, unusual weight change, abnormal bleeding, enlarged lymph nodes, angioedema, and breast masses.  Past Medical History: Past Medical History  Diagnosis Date  . Hypercholesteremia    Past Surgical History  Procedure Date  . Testicle removal   . Bunionectomy     Medications: Prior to Admission medications   Not on File    Allergies:  No Known Allergies  Social History:  reports that he has been smoking Cigarettes.  He does not have any smokeless tobacco history on file. He reports that he does not drink alcohol or use illicit drugs.   Family History: History reviewed. No pertinent family history.  Physical Exam: Filed Vitals:   01/06/12 0615 01/06/12 0949 01/06/12 1500 01/06/12 1527  BP:  135/83 108/74 135/77 135/77  Pulse: 61 75 64 67  Temp:  98.2 F (36.8 C)  98.5 F (36.9 C)  TempSrc:  Oral  Oral  Resp: 14 12 16 15   SpO2: 97% 96% 96% 96%   .Constitutional: Vital signs reviewed.  Patient is a well-developed and well-nourished  in no acute distress and cooperative with exam. Alert and oriented x3.  Head: Normocephalic and atraumatic Mouth: no erythema or exudates, MMM Eyes: PERRL, EOMI, conjunctivae normal, No scleral icterus.  Neck: Supple, Trachea midline normal ROM, No JVD, mass, thyromegaly, or carotid bruit present.  Cardiovascular: RRR, S1 normal, S2 normal, no MRG, pulses symmetric and intact bilaterally Pulmonary/Chest: CTAB, no wheezes, rales, or rhonchi Abdominal: Soft. Obese, mild tenderness in the epigastric area. non-distended, bowel sounds are normal, no masses, organomegaly, or guarding present.  Musculoskeletal: No joint deformities, erythema, or stiffness, ROM full and no nontender Hematology: no cervical, inginal, or axillary adenopathy.  Neurological: A&O x3, Strenght is normal and symmetric bilaterally, cranial nerve II-XII are grossly intact, no focal motor deficit, sensory intact to light touch bilaterally.  Skin: Warm, dry and intact. No rash, cyanosis, or clubbing.       Labs on Admission:   [facility] 01/06/12 0027  NA 138  K 4.0  CL 102  CO2 27  GLUCOSE 134*  BUN 14  CREATININE 1.07  CALCIUM 9.6  MG --  PHOS --   No results found for this basename: AST:2,ALT:2,ALKPHOS:2,BILITOT:2,PROT:2,ALBUMIN:2 in the last 72 hours No results found for this basename: LIPASE:2,AMYLASE:2 in the last 72 hours  Basename 01/06/12 0027  WBC 9.3  NEUTROABS 5.3  HGB 13.2  HCT 36.8*  MCV 74.6*  PLT 254    Basename 01/06/12 0027  CKTOTAL 330*  CKMB 2.4  CKMBINDEX --  TROPONINI <0.30   No results found for this basename: TSH,T4TOTAL,FREET3,T3FREE,THYROIDAB in the last 72 hours No results found for this basename:  VITAMINB12:2,FOLATE:2,FERRITIN:2,TIBC:2,IRON:2,RETICCTPCT:2 in the last 72 hours  Radiological Exams on Admission: Dg Chest 2 View  01/06/2012  *RADIOLOGY REPORT*  Clinical Data: Chest pain and shortness of breath for two or 3 weeks.  CHEST - 2 VIEW  Comparison: 08/15/2011  Findings: Shallow inspiration.  Heart size and pulmonary vascularity are normal.  No focal airspace consolidation in the lungs.  No blunting of costophrenic angles.  No pneumothorax.  Old left rib fracture.  No significant change since previous study.  IMPRESSION: No evidence of active pulmonary disease.  Original Report Authenticated By: Marlon Pel, M.D.   Ct Angio Chest W/cm &/or Wo Cm  01/06/2012  *RADIOLOGY REPORT*  Clinical Data: Chest pain and shortness of breath for 2-3 weeks.  CT ANGIOGRAPHY CHEST  Technique:  Multidetector CT imaging of the chest using the standard protocol during bolus administration of intravenous contrast. Multiplanar reconstructed images including MIPs were obtained and reviewed to evaluate the vascular anatomy.  Contrast: OMNIPAQUE IOHEXOL 300 MG/ML IV SOLN  Comparison: None.  Findings: Technically adequate study with good opacification of the central and segmental pulmonary arteries.  No focal filling defects demonstrated.  No evidence of significant pulmonary embolus. Normal caliber thoracic aorta.  Calcification of coronary arteries. Normal heart size.  No significant lymphadenopathy in the chest. Respiratory motion artifact limits visualization of the lung fields but there is no evidence of focal airspace consolidation or interstitial disease.  Mild dependent atelectasis. 4 mm nodule in the right middle lung is indeterminate but likely benign in a low risk patient.  If the patient is at high risk for bronchogenic carcinoma, follow-up chest CT at 1 year is recommended.  If the patient is at low risk, no follow-up is needed.  This recommendation follows the consensus statement: Guidelines for  Management of Small Pulmonary Nodules Detected on CT Scans:  A Statement from the Fleischner Society as published in Radiology 2005; 237:395-400.  Available online at: [email or web address].cz. No pleural effusion.  No pneumothorax.  Normal alignment of the thoracic vertebrae.  IMPRESSION: No evidence of significant pulmonary embolus.  4 mm indeterminate nodule in the right middle lung.  See above recommendations.  Original Report Authenticated By: Marlon Pel, M.D.    Assessment/Plan Present on Admission:  .Chest pain: 1 st set of cardiac enzymes negative, EKG is normal sinus rhythm, stress test in conclusive this am. Going for myo view in am. Start patient on aspirin, meanwhile we will get amy lase, lipase and liver function tess, as he is complaining of some epigastric pain. GI cocktail, and protonix ordered.   DVT prophylaxis:   After discussion with the patient, he is to be a full code.  We will respect these wishes.   Time spent on this patient including examination and decision-making process: 48 minutes.  Lova Urbieta [phone] 01/06/2012, 3:43 PM

## 2012-01-06 NOTE — ED Provider Notes (Signed)
7:51 AM Pt seen and examined by me this morning. Pt has had an uneventful night, in CDU awaiting stress test. Last set of enzymes just drawn. Pt denies any chest pain at this moment. Pt in NAD, AAOx3, lungs clear to auscultation bilaterally. Regular HR and rhythm. Will continue to monitor.   Lottie Mussel, PA 01/06/12 [phone]  Spoke with cardiologist, pt did notreach max HR required for stress test, non diagnostic. Recommended Nuclear Myoview. Nuclear unable to do the test today. PT continues to have intermittent chest pains. Atypical. Morphine given. VS normal. Will admit.  2:31 PM Spoke with triad. Will admit to Team one for obs.  Lottie Mussel, PA 01/06/12 1431

## 2012-01-06 NOTE — ED Notes (Signed)
Pt returned from testing.

## 2012-01-06 NOTE — ED Notes (Signed)
Transported to echo

## 2012-01-06 NOTE — ED Notes (Signed)
Per cardiologist, dobutamine stress test to be canceled. recommened nuclear stress test instead. PA to re-eval with edp.

## 2012-01-06 NOTE — ED Notes (Signed)
Patient denies pain and is resting comfortably.  

## 2012-01-06 NOTE — ED Notes (Signed)
EDMD at bedside

## 2012-01-06 NOTE — ED Notes (Addendum)
Asked if she needed another EKG this AM and she stated no. Per Dr. Hyman Hopes, pt needs to just be NPO until stress test. Will continue to monitor.

## 2012-01-06 NOTE — Progress Notes (Signed)
Stress Echo completed.  Emelia Loron, RDCS

## 2012-01-06 NOTE — Progress Notes (Signed)
Observation review is complete. 

## 2012-01-06 NOTE — ED Notes (Signed)
Pt states having active chest and arm pain. PA made aware, no order given.

## 2012-01-06 NOTE — ED Notes (Signed)
2009-01 Ready

## 2012-01-06 NOTE — ED Provider Notes (Signed)
Patient not in room when I went to see him at 08 30.   I was called at 0901 by Annabelle Harman from [facility] cardiology who states patient fair to have a stress echo but states he's having active chest pain described as burning like a volcano. She states they cannot do  stress echo in someone with active chest pain. She advises that if we getting pain free he can come back before 2 PM.  [phone] I was called back by Annabelle Harman who states patient is now pain-free and they were going to proceed with his test.  10:10 patient describes intermittent chest discomfort for the past month. He states pain starts underneath both arms and goes up into his chest and abdomen. He states he can only get relief if he gets on his knees and legs up against his bed with his arms up over the bed and he rubs his chest on the bed. He states it's a burning and aching pain in normal 8 last a few minutes per is wife he states it lasted about 5-10 minutes. He states is worse when he lays flat.   We are waiting for results of his stress echo at this time.  Devoria Albe, MD, Armando Gang   Ward Givens, MD 01/06/12 1550

## 2012-01-06 NOTE — ED Notes (Signed)
Returned from CT - awake, alert, appears sedated; appropriate in conversation; ccm showing SR rate 70 without ectopy

## 2012-01-07 ENCOUNTER — Observation Stay (HOSPITAL_COMMUNITY): Payer: Self-pay

## 2012-01-07 ENCOUNTER — Other Ambulatory Visit: Payer: Self-pay

## 2012-01-07 LAB — BASIC METABOLIC PANEL
BUN: 14 mg/dL (ref 6–23)
Calcium: 9.4 mg/dL (ref 8.4–10.5)
Creatinine, Ser: 0.85 mg/dL (ref 0.50–1.35)
GFR calc Af Amer: >90 mL/min (ref >90)
GFR calc non Af Amer: >90 mL/min (ref >90)
Glucose, Bld: 99 mg/dL (ref 70–99)
Potassium: 4.1 mEq/L (ref 3.5–5.1)

## 2012-01-07 LAB — CBC
HCT: 39.1 % (ref 39.0–52.0)
Hemoglobin: 13.9 g/dL (ref 13.0–17.0)
MCH: 26.6 pg (ref 26.0–34.0)
MCHC: 35.5 g/dL (ref 30.0–36.0)
RDW: 14.8 % (ref 11.5–15.5)

## 2012-01-07 LAB — CARDIAC PANEL(CRET KIN+CKTOT+MB+TROPI)
CK, MB: 2.2 ng/mL (ref 0.3–4.0)
Total CK: 234 U/L — ABNORMAL HIGH (ref 7–232)
Troponin I: <0.3 ng/mL (ref <0.30)
Troponin I: <0.3 ng/mL (ref <0.30)

## 2012-01-07 LAB — HEMOGLOBIN A1C: Mean Plasma Glucose: 117 mg/dL — ABNORMAL HIGH (ref <117)

## 2012-01-07 MED ORDER — HYDROCODONE-ACETAMINOPHEN 5-325 MG PO TABS: 2.0000 | ORAL_TABLET | Freq: Four times a day (QID) | ORAL | Status: AC | PRN

## 2012-01-07 MED ORDER — HYDROCODONE-ACETAMINOPHEN 5-325 MG PO TABS
1.0000 | ORAL_TABLET | Freq: Four times a day (QID) | ORAL | Status: DC | PRN
  Administered 2012-01-07: 1 via ORAL
  Filled 2012-01-07: qty 1

## 2012-01-07 MED ORDER — ASPIRIN 81 MG PO CHEW: 81.0000 mg | CHEWABLE_TABLET | Freq: Every day | ORAL | Status: DC

## 2012-01-07 MED ORDER — REGADENOSON 0.4 MG/5ML IV SOLN
0.4000 mg | Freq: Once | INTRAVENOUS | Status: AC
  Administered 2012-01-07: 0.4 mg via INTRAVENOUS

## 2012-01-07 MED ORDER — PANTOPRAZOLE SODIUM 40 MG PO TBEC: 40.0000 mg | DELAYED_RELEASE_TABLET | Freq: Every day | ORAL | Status: DC

## 2012-01-07 MED ORDER — TECHNETIUM TC 99M TETROFOSMIN IV KIT
10.0000 | PACK | Freq: Once | INTRAVENOUS | Status: AC | PRN
  Administered 2012-01-07: 10 via INTRAVENOUS

## 2012-01-07 MED ORDER — HYDROCODONE-ACETAMINOPHEN 5-325 MG PO TABS
2.0000 | ORAL_TABLET | Freq: Four times a day (QID) | ORAL | Status: DC | PRN
  Administered 2012-01-07: 1 via ORAL
  Filled 2012-01-07: qty 1

## 2012-01-07 MED ORDER — TECHNETIUM TC 99M TETROFOSMIN IV KIT
30.0000 | PACK | Freq: Once | INTRAVENOUS | Status: AC | PRN
  Administered 2012-01-07: 30 via INTRAVENOUS

## 2012-01-07 NOTE — Progress Notes (Signed)
   CARE MANAGEMENT NOTE 01/07/2012  Patient:  Douglas Glass, Douglas Glass   Account Number:  [id number]  Date Initiated:  01/07/2012  Documentation initiated by:  Letha Cape  Subjective/Objective Assessment:   dx chest pain  lives wiht spouse.     Action/Plan:   Anticipated DC Date:  01/07/2012   Anticipated DC Plan:  HOME/SELF CARE      DC Planning Services  CM consult      Choice offered to / List presented to:             Status of service:  Completed, signed off Medicare Important Message given?   (If response is "NO", the following Medicare IM given date fields will be blank) Date Medicare IM given:   Date Additional Medicare IM given:    Discharge Disposition:  HOME/SELF CARE  Per UR Regulation:    Comments:  PCP [facility] Dr. Philipp Deputy , hospital f/u for 3/8 at 11 am  01/07/12 15:12 Letha Cape RN, BSN [phone] Patient for dc today, pta independent.  Patient has orange card for meds and has been followed at [facility] previously, f/u appt is set up for 3/8 at 11 am.  Per [facility] they made referral to pain clinic and patient was denied for this.

## 2012-01-07 NOTE — Progress Notes (Signed)
Pt. Completed Lexiscan myoview without complications.  He had bilateral leg pain prior to the study that continues at  The end of the study.  No EKG changes.  Results to follow.

## 2012-01-07 NOTE — Discharge Summary (Signed)
Admit date: 01/05/2012 Discharge date: 01/07/2012  Primary Care Physician:  Sheila Oats, MD, MD   Discharge Diagnoses:   Atypical Chest pain, probably muscle skeletal pain.   DISCHARGE MEDICATION: Vicodin 5-325 gm 1 to 2 tablet every 6 hours as needed for pain.  Protonix 40 mg po daily.  Aspirin 81 mg PO daily.   Consults: Treatment Team:  Chrystie Nose, MD   SIGNIFICANT DIAGNOSTIC STUDIES:  Dg Chest 2 View  01/06/2012  *RADIOLOGY REPORT*  Clinical Data: Chest pain and shortness of breath for two or 3 weeks.  CHEST - 2 VIEW  Comparison: 08/15/2011  Findings: Shallow inspiration.  Heart size and pulmonary vascularity are normal.  No focal airspace consolidation in the lungs.  No blunting of costophrenic angles.  No pneumothorax.  Old left rib fracture.  No significant change since previous study.  IMPRESSION: No evidence of active pulmonary disease.  Original Report Authenticated By: Marlon Pel, M.D.   Ct Angio Chest W/cm &/or Wo Cm  01/06/2012  *RADIOLOGY REPORT*  Clinical Data: Chest pain and shortness of breath for 2-3 weeks.  CT ANGIOGRAPHY CHEST  Technique:  Multidetector CT imaging of the chest using the standard protocol during bolus administration of intravenous contrast. Multiplanar reconstructed images including MIPs were obtained and reviewed to evaluate the vascular anatomy.  Contrast: OMNIPAQUE IOHEXOL 300 MG/ML IV SOLN  Comparison: None.  Findings: Technically adequate study with good opacification of the central and segmental pulmonary arteries.  No focal filling defects demonstrated.  No evidence of significant pulmonary embolus. Normal caliber thoracic aorta.  Calcification of coronary arteries. Normal heart size.  No significant lymphadenopathy in the chest. Respiratory motion artifact limits visualization of the lung fields but there is no evidence of focal airspace consolidation or interstitial disease.  Mild dependent atelectasis. 4 mm nodule in the  right middle lung is indeterminate but likely benign in a low risk patient.  If the patient is at high risk for bronchogenic carcinoma, follow-up chest CT at 1 year is recommended.  If the patient is at low risk, no follow-up is needed.  This recommendation follows the consensus statement: Guidelines for Management of Small Pulmonary Nodules Detected on CT Scans:  A Statement from the Fleischner Society as published in Radiology 2005; 237:395-400.  Available online at: [email or web address].cz. No pleural effusion.  No pneumothorax.  Normal alignment of the thoracic vertebrae.  IMPRESSION: No evidence of significant pulmonary embolus.  4 mm indeterminate nodule in the right middle lung.  See above recommendations.  Original Report Authenticated By: Marlon Pel, M.D.     ECHO:   - Stress: Maximal heart rate during stress was 126bpm (74% of maximal predicted heart rate). The maximal predicted heart rate was 170bpm.The target heart rate was not achieved. The rate-pressure product for the peak heart rate and blood pressure was Hg/min. - Stress ECG conclusions: Sinus tachycardia. No exercise induced ischemic EKG changes. The stress ECG was non-diagnostic, due to lack of achieving target heart rate. - Baseline: Mild concentric LVH. LVEF 55%. No rest wall motion abnormalities. - Peak stress: Expected increase in LV function globally, EF 65-70%. No obvious focal wall motion abnormalities. - Impressions: Non-diagnostic stress echo test as target heart rate was not achieved. Recommend a lexiscan myoview for perfusion imaging if clinical concern for cardiac ischemia warrants it. Impressions:  - Non-diagnostic stress echo test as target heart rate was not achieved. Recommend a lexiscan myoview for perfusion imaging if clinical concern for cardiac ischemia warrants it.  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 Chest pain was substernal, occurs without any relation to activity, intermittent, aching time of pain, stays for 5 to 7 minutes and resolved spontaneously, also describes the pain radiating to both arms. Has occasional nausea, no vomiting, no fever ,chills, or sob. No syncope, palpitations. Pt was in ED since yesterday under chest pain protocol. His enzymes have been negative, so far. EKG does not show any ST T wave changes. He went for stress test this morning, but the results were inconclusive, as patient was not able to reach maximum heart rate. We were requested to admit the patient for lexiview in am by [facility].   Hospital Course:  Chest pain:  Patient was admitted to telemetry. Troponin times 3 negative. Stress ECHO inconclusive. Lexi-scan was negative.  Ct angio was  negative for PE or dissention.   Chronic back and right leg pain: patient relates history of nerve damage. His PCP stop giving him percocet.  He was prescribe tramadol but that doesn't help. 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  I will provide prescription for Vicodin until he is able to follow up with PCP.   4 mm lung nodule: He is current smoker, he is aware that he needs Ct chest to follow lung nodule.   Disposition and Follow-up:   Follow-up Information    Follow up with DEFAULT,PROVIDER, MD .          DISCHARGE EXAM:   Constitutional: Vital signs reviewed. Patient is a well-developed and well-nourished in no acute distress and cooperative with exam. Alert and oriented x3.  Head: Normocephalic and atraumatic  Mouth: no erythema or exudates, MMM  Eyes: PERRL, EOMI, conjunctivae normal, No scleral icterus.  Neck: Supple, Trachea midline normal ROM, No JVD, mass, thyromegaly, or carotid bruit present.  Cardiovascular: RRR, S1 normal, S2 normal, no MRG, pulses symmetric and intact bilaterally  Pulmonary/Chest: CTAB, no wheezes, rales, or rhonchi    Abdominal: Soft. Obese, mild tenderness in the epigastric area. non-distended, bowel sounds are normal, no masses, organomegaly, or guarding present.  Musculoskeletal: No joint deformities, erythema, or stiffness, ROM full and no nontender Hematology: no cervical, inginal, or axillary adenopathy.  Neurological: A&O x3, Strenght is normal and symmetric bilaterally, cranial nerve II-XII are grossly intact, no focal motor deficit, sensory intact to light touch bilaterally.  Skin: Warm, dry and intact. No rash, cyanosis, or clubbing.    Blood pressure 148/108, pulse 72, temperature 97.8 F (36.6 C), temperature source Oral, resp. rate 20, height 5\' 9"  (1.753 m), weight 110.5 kg (243 lb 9.7 oz), SpO2 96.00%.   Basename 01/30/Douglas 0545 01/29/Douglas 1650 01/29/Douglas 0027  NA 139 -- 138  K 4.1 -- 4.0  CL 103 -- 102  CO2 27 -- 27  GLUCOSE 99 -- 134*  BUN 14 -- 14  CREATININE 0.85 0.91 --  CALCIUM 9.4 -- 9.6  MG -- -- --  PHOS -- -- --    Basename 01/29/Douglas 1650  AST 17  ALT 25  ALKPHOS 81  BILITOT 0.5  PROT 7.7  ALBUMIN 3.5    Basename 01/29/Douglas 1650  LIPASE 19  AMYLASE 60    Basename 01/30/Douglas 0545 01/29/Douglas 1650 01/29/Douglas 0027  WBC 7.9 7.7 --  NEUTROABS -- -- 5.3  HGB Douglas.9 Douglas.3 --  HCT 39.1 39.0 --  MCV 74.9* 74.7* --  PLT 268 260 --    Signed: REGALADO,BELKYS M.D. 01/07/2012, 2:14 PM

## 2012-01-07 NOTE — Progress Notes (Signed)
Pt smokes 1 ppd and says he wants to quit. Recommended 21 mg patch x 6 weeks, 14 mg patch x 2 weeks and 7 mg patch x 2 weeks. Discussed patch use instructions. Referred to 1-800 quit now for f/u and support. Discussed oral fixation substitutes, second hand smoke and in home smoking policy. Reviewed and gave pt Written education/contact information.

## 2012-01-07 NOTE — Progress Notes (Signed)
I did not see the patient.   I agree with Corliss Blacker plan.

## 2012-01-07 NOTE — Progress Notes (Signed)
Pt. Did not reach target HR with Stress echo yesterday, nor did he have good windows with echo.  We will obtain lexiscan myoview today.  Pt. Has been NPO.  If positive will do full consult.

## 2012-01-07 NOTE — Progress Notes (Signed)
Lexiscan Myoview completed.    Results have been reviewed - EF 54%, no ischemia or infarct.  We will sign off -- no formal consult.  Marykay Lex, M.D., M.S. [facility] [address] [place], [place]  [address]  [phone]  01/07/2012 5:50 PM

## 2012-01-07 NOTE — Plan of Care (Signed)
Problem: Phase II Progression Outcomes Goal: Stress Test if indicated Outcome: Completed/Met Date Met:  01/07/12 lexiscan this morning

## 2012-02-03 ENCOUNTER — Emergency Department (HOSPITAL_COMMUNITY)
Admission: EM | Admit: 2012-02-03 | Discharge: 2012-02-03 | Disposition: A | Discharge status: Home Health | Payer: Self-pay | Attending: Emergency Medicine | Admitting: Emergency Medicine

## 2012-02-03 ENCOUNTER — Encounter (HOSPITAL_COMMUNITY): Payer: Self-pay | Admitting: *Deleted

## 2012-02-03 ENCOUNTER — Emergency Department (HOSPITAL_COMMUNITY): Payer: Self-pay

## 2012-02-03 DIAGNOSIS — R109 Unspecified abdominal pain: Secondary | ICD-10-CM | POA: Insufficient documentation

## 2012-02-03 DIAGNOSIS — M549 Dorsalgia, unspecified: Secondary | ICD-10-CM | POA: Insufficient documentation

## 2012-02-03 DIAGNOSIS — N343 Urethral syndrome, unspecified: Secondary | ICD-10-CM | POA: Insufficient documentation

## 2012-02-03 DIAGNOSIS — I10 Essential (primary) hypertension: Secondary | ICD-10-CM | POA: Insufficient documentation

## 2012-02-03 DIAGNOSIS — F17200 Nicotine dependence, unspecified, uncomplicated: Secondary | ICD-10-CM | POA: Insufficient documentation

## 2012-02-03 DIAGNOSIS — G8929 Other chronic pain: Secondary | ICD-10-CM | POA: Insufficient documentation

## 2012-02-03 DIAGNOSIS — E7800 Pure hypercholesterolemia, unspecified: Secondary | ICD-10-CM | POA: Insufficient documentation

## 2012-02-03 HISTORY — DX: Dorsalgia, unspecified: M54.9

## 2012-02-03 HISTORY — DX: Inflammatory disease of prostate, unspecified: N41.9

## 2012-02-03 HISTORY — DX: Other chronic pain: G89.29

## 2012-02-03 HISTORY — DX: Solitary pulmonary nodule: R91.1

## 2012-02-03 HISTORY — DX: Essential (primary) hypertension: I10

## 2012-02-03 HISTORY — DX: Personal history of other infectious and parasitic diseases: Z86.19

## 2012-02-03 LAB — URINALYSIS, ROUTINE W REFLEX MICROSCOPIC
Bilirubin Urine: NEGATIVE
Glucose, UA: NEGATIVE mg/dL
Hgb urine dipstick: NEGATIVE
Specific Gravity, Urine: 1.024 (ref 1.005–1.030)
Urobilinogen, UA: 0.2 mg/dL (ref 0.0–1.0)
pH: 5.5 (ref 5.0–8.0)

## 2012-02-03 MED ORDER — PHENAZOPYRIDINE HCL 200 MG PO TABS: 200.0000 mg | ORAL_TABLET | Freq: Three times a day (TID) | ORAL | Status: AC

## 2012-02-03 MED ORDER — OXYCODONE-ACETAMINOPHEN 5-325 MG PO TABS
2.0000 | ORAL_TABLET | Freq: Once | ORAL | Status: AC
  Administered 2012-02-03: 2 via ORAL
  Filled 2012-02-03: qty 2

## 2012-02-03 MED ORDER — KETOROLAC TROMETHAMINE 30 MG/ML IJ SOLN
30.0000 mg | Freq: Once | INTRAMUSCULAR | Status: AC
  Administered 2012-02-03: 30 mg via INTRAMUSCULAR
  Filled 2012-02-03: qty 1

## 2012-02-03 MED ORDER — PHENAZOPYRIDINE HCL 100 MG PO TABS
200.0000 mg | ORAL_TABLET | Freq: Once | ORAL | Status: AC
  Administered 2012-02-03: 200 mg via ORAL
  Filled 2012-02-03: qty 1

## 2012-02-03 NOTE — ED Provider Notes (Signed)
History     CSN: [id number]  Arrival date & time 02/03/12  1805   First MD Initiated Contact with Patient 02/03/12 1909      Chief Complaint  Patient presents with  . Groin Pain  . Back Pain    HPI Pt was seen at 1900.  Per pt, c/o gradual onset and persistence of constant dysuria for the past several days.  Describes the pain as a "stinging" pain.  Has been assoc with left sided back pain radiating into his left groin, as well as genital area "itching."  Denies rectal or perineal pain, no fevers, no rash, no hematuria, no N/V/D, no abd pain, no CP/SOB.  Denies incont/retention of bowel or bladder, no saddle anesthesia, no focal motor weakness, no tingling/numbness in extremities, no injury.   Pt has significant hx of similar complaints and has been eval in ED multiple times for same.      Past Medical History  Diagnosis Date  . Hypercholesteremia   . Hypertension   . Prostatitis   . Chronic back pain   . Hx of chlamydia infection   . Incidental lung nodule, > 3mm and < 8mm     Past Surgical History  Procedure Date  . Testicle removal   . Bunionectomy     History  Substance Use Topics  . Smoking status: Current Everyday Smoker    Types: Cigarettes  . Smokeless tobacco: Not on file  . Alcohol Use: No    Review of Systems ROS: Statement: All systems negative except as marked or noted in the HPI; Constitutional: Negative for fever and chills. ; ; Eyes: Negative for eye pain, redness and discharge. ; ; ENMT: Negative for ear pain, hoarseness, nasal congestion, sinus pressure and sore throat. ; ; Cardiovascular: Negative for chest pain, palpitations, diaphoresis, dyspnea and peripheral edema. ; ; Respiratory: Negative for cough, wheezing and stridor. ; ; Gastrointestinal: Negative for nausea, vomiting, diarrhea, abdominal pain, blood in stool, hematemesis, jaundice and rectal bleeding. . ; ; Genitourinary: +dysuria. Negative for flank pain and hematuria.; Genital:  No penile  drainage or rash, no testicular pain or swelling, no scrotal rash or swelling; ; Musculoskeletal: +LBP. Negative for neck pain. Negative for swelling and trauma.; ; Skin: Negative for pruritus, rash, abrasions, blisters, bruising and skin lesion.; ; Neuro: Negative for headache, lightheadedness and neck stiffness. Negative for weakness, altered level of consciousness , altered mental status, extremity weakness, paresthesias, involuntary movement, seizure and syncope.     Allergies  Review of patient's allergies indicates no known allergies.  Home Medications   Current Outpatient Rx  Name Route Sig Dispense Refill  . TAMSULOSIN HCL 0.4 MG PO CAPS Oral Take 0.4 mg by mouth.      BP 145/81  Pulse 77  Temp(Src) 98.1 F (36.7 C) (Oral)  Resp 16  SpO2 94%  Physical Exam 1905: Physical examination:  Nursing notes reviewed; Vital signs and O2 SAT reviewed;  Constitutional: Well developed, Well nourished, Well hydrated, In no acute distress; Head:  Normocephalic, atraumatic; Eyes: EOMI, PERRL, No scleral icterus; ENMT: Mouth and pharynx normal, Mucous membranes moist; Neck: Supple, Full range of motion, No lymphadenopathy; Cardiovascular: Regular rate and rhythm, No murmur, rub, or gallop; Respiratory: Breath sounds clear & equal bilaterally, No rales, rhonchi, wheezes, or rub, Normal respiratory effort/excursion; Chest: Nontender, Movement normal; Abdomen: Soft, Nontender, Nondistended, Normal bowel sounds; Genitourinary: No CVA tenderness, Genital and rectal exam performed with pt permission and male ED Tech chaparone present during exam.  Pt examined laying. Genital hairs appear shaved short. No perineal erythema.  No penile lesions or drainage.  No scrotal erythema, edema or tenderness to palp.  Normal right testicular lie with left testicle surgically absent.  No testicular tenderness to palp.  +cremasteric reflexes bilat.  No inguinal LAN or palpable masses.  Prostate NT on rectal exam.; Spine:   No midline CS, TS, LS tenderness, +TTP left lumbar paraspinal muscles.  No rash.;  Extremities: Pulses normal, No tenderness, No edema, No calf edema or asymmetry.; Neuro: AA&Ox3, Major CN grossly intact.  No gross focal motor or sensory deficits in extremities.; Skin: Color normal, Warm, Dry, no rash.     ED Course  Procedures   MDM  MDM Reviewed: previous chart, nursing note and vitals Interpretation: labs and CT scan   Results for orders placed during the hospital encounter of 02/03/12  URINALYSIS, ROUTINE W REFLEX MICROSCOPIC      Component Value Range   Color, Urine YELLOW  YELLOW    APPearance CLEAR  CLEAR    Specific Gravity, Urine 1.024  1.005 - 1.030    pH 5.5  5.0 - 8.0    Glucose, UA NEGATIVE  NEGATIVE (mg/dL)   Hgb urine dipstick NEGATIVE  NEGATIVE    Bilirubin Urine NEGATIVE  NEGATIVE    Ketones, ur NEGATIVE  NEGATIVE (mg/dL)   Protein, ur NEGATIVE  NEGATIVE (mg/dL)   Urobilinogen, UA 0.2  0.0 - 1.0 (mg/dL)   Nitrite NEGATIVE  NEGATIVE    Leukocytes, UA NEGATIVE  NEGATIVE      2030:   During genital and rectal exam, pt repeated to me to "go slow" and "touch it easy" and "touch it slowly."  Pt has been eval in ED multiple times for this same complaint without findings of acute pathology, have concern regarding these statements made during physical exam to myself and male ED staff chaparone.  Doubt need for testicular US today as scrotum/tesicle is NT.  No UTI.  UC and GC/chlam pending.  No rash on groin area.  CT A/P r/o ureteral calculi pending.  Sign out to NP Manus Rudd.           Laray Anger, DO 02/05/12 [phone]

## 2012-02-03 NOTE — ED Notes (Signed)
Patient states he is having pain in his prostate and in his back.  Patient states he is concerned that he may have an infection.  Patient states he is having pain when he voids.  States it is a stinging pain.  He also report itching in his penis/area

## 2012-02-03 NOTE — ED Provider Notes (Signed)
History     CSN: [id number]  Arrival date & time 02/03/12  1805   First MD Initiated Contact with Patient 02/03/12 1909      Chief Complaint  Patient presents with  . Groin Pain  . Back Pain    (Consider location/radiation/quality/duration/timing/severity/associated sxs/prior treatment) HPI  Past Medical History  Diagnosis Date  . Hypercholesteremia   . Hypertension   . Prostatitis   . Chronic back pain   . Hx of chlamydia infection   . Incidental lung nodule, > 3mm and < 8mm     Past Surgical History  Procedure Date  . Testicle removal   . Bunionectomy     No family history on file.  History  Substance Use Topics  . Smoking status: Current Everyday Smoker    Types: Cigarettes  . Smokeless tobacco: Not on file  . Alcohol Use: No      Review of Systems  Allergies  Review of patient's allergies indicates no known allergies.  Home Medications   Current Outpatient Rx  Name Route Sig Dispense Refill  . TAMSULOSIN HCL 0.4 MG PO CAPS Oral Take 0.4 mg by mouth.    [name] PHENAZOPYRIDINE HCL 200 MG PO TABS Oral Take 1 tablet (200 mg total) by mouth 3 (three) times daily. 6 tablet 0    BP 145/81  Pulse 77  Temp(Src) 98.1 F (36.7 C) (Oral)  Resp 16  SpO2 94%  Physical Exam  ED Course  Procedures (including critical care time)   Labs Reviewed  URINALYSIS, ROUTINE W REFLEX MICROSCOPIC  URINE CULTURE  GC/CHLAMYDIA PROBE AMP, GENITAL   Ct Abdomen Pelvis Wo Contrast  02/03/2012  *RADIOLOGY REPORT*  Clinical Data: Pain in the prostate and back.  CT ABDOMEN AND PELVIS WITHOUT CONTRAST  Technique:  Multidetector CT imaging of the abdomen and pelvis was performed following the standard protocol without intravenous contrast.  Comparison: None.  Findings: 4 mm noncalcified nodule in the right middle lobe on sequence 3, image 12.  There is no evidence for free air.  No evidence for kidney stones or hydronephrosis.  There may be a small exophytic cyst along the left  kidney.  Normal appearance of the adrenal tissue.  Evidence for a left coronary artery calcification.  There is no gross abnormality liver, gallbladder pancreas or spleen.  There is no significant free fluid or lymphadenopathy.  There is no gross abnormality to the prostate, seminal vesicles or urinary bladder.  The appendix has a normal appearance.  IMPRESSION: No acute abdominal or pelvic findings.  4 mm nodule in the right middle lobe. If the patient is at high risk for bronchogenic carcinoma, follow-up chest CT at 1 year is recommended.  If the patient is at low risk, no follow-up is needed.  This recommendation follows the consensus statement: Guidelines for Management of Small Pulmonary Nodules Detected on CT Scans:  A Statement from the Fleischner Society as published in Radiology 2005; 237:395-400.  Coronary artery calcification.  Original Report Authenticated By: Richarda Overlie, M.D.     1. Dysuria-frequency syndrome       MDM  dysuria        Arman Filter, NP 02/03/12 2134

## 2012-02-03 NOTE — ED Notes (Signed)
Male chaperone Tammy Sours at the patient bedside for [facility] collection

## 2012-02-03 NOTE — Discharge Instructions (Signed)
Today urine sample was negative for any urinary tract infection, you were tested for STD and cultures are pending.  Your abdominal CT is normal, revealing no kidney stones.  No abnormal prostate, gallbladder, kidneys, pancreas, intestines are all, normal.  You've been prescribed a medication for your symptoms of dysuria.  Please make an appointment with [facility] for further evaluation

## 2012-02-04 LAB — URINE CULTURE
Colony Count: NO GROWTH
Culture  Setup Time: 201302270137
Culture: NO GROWTH

## 2012-02-05 NOTE — ED Provider Notes (Signed)
Medical screening examination/treatment/procedure(s) were conducted as a shared visit with non-physician practitioner(s) and myself.  I personally evaluated the patient during the encounter See my previous note.  Laray Anger, DO 02/05/12 1635

## 2012-04-29 ENCOUNTER — Encounter (HOSPITAL_COMMUNITY): Payer: Self-pay | Admitting: Emergency Medicine

## 2012-04-29 ENCOUNTER — Emergency Department (HOSPITAL_COMMUNITY)
Admission: EM | Admit: 2012-04-29 | Discharge: 2012-04-29 | Disposition: A | Discharge status: Home / Self Care | Payer: Self-pay | Attending: Emergency Medicine | Admitting: Emergency Medicine

## 2012-04-29 DIAGNOSIS — M545 Low back pain, unspecified: Secondary | ICD-10-CM | POA: Insufficient documentation

## 2012-04-29 DIAGNOSIS — N400 Enlarged prostate without lower urinary tract symptoms: Secondary | ICD-10-CM | POA: Insufficient documentation

## 2012-04-29 DIAGNOSIS — R0989 Other specified symptoms and signs involving the circulatory and respiratory systems: Secondary | ICD-10-CM | POA: Insufficient documentation

## 2012-04-29 DIAGNOSIS — I10 Essential (primary) hypertension: Secondary | ICD-10-CM | POA: Insufficient documentation

## 2012-04-29 DIAGNOSIS — G8929 Other chronic pain: Secondary | ICD-10-CM | POA: Insufficient documentation

## 2012-04-29 DIAGNOSIS — R0609 Other forms of dyspnea: Secondary | ICD-10-CM | POA: Insufficient documentation

## 2012-04-29 DIAGNOSIS — E7800 Pure hypercholesterolemia, unspecified: Secondary | ICD-10-CM | POA: Insufficient documentation

## 2012-04-29 DIAGNOSIS — R300 Dysuria: Secondary | ICD-10-CM | POA: Insufficient documentation

## 2012-04-29 DIAGNOSIS — R350 Frequency of micturition: Secondary | ICD-10-CM | POA: Insufficient documentation

## 2012-04-29 LAB — POCT I-STAT, CHEM 8
BUN: 16 mg/dL (ref 6–23)
Calcium, Ion: 1.24 mmol/L (ref 1.12–1.32)
Chloride: 103 mEq/L (ref 96–112)
Creatinine, Ser: 1.1 mg/dL (ref 0.50–1.35)

## 2012-04-29 LAB — URINALYSIS, ROUTINE W REFLEX MICROSCOPIC
Glucose, UA: NEGATIVE mg/dL
Ketones, ur: NEGATIVE mg/dL
Leukocytes, UA: NEGATIVE
Protein, ur: NEGATIVE mg/dL
pH: 5.5 (ref 5.0–8.0)

## 2012-04-29 LAB — URINE MICROSCOPIC-ADD ON

## 2012-04-29 MED ORDER — OXYCODONE-ACETAMINOPHEN 5-325 MG PO TABS
1.0000 | ORAL_TABLET | Freq: Once | ORAL | Status: AC
  Administered 2012-04-29: 1 via ORAL
  Filled 2012-04-29: qty 1

## 2012-04-29 MED ORDER — TAMSULOSIN HCL 0.4 MG PO CAPS: 0.4000 mg | ORAL_CAPSULE | Freq: Every day | ORAL | Status: DC

## 2012-04-29 MED ORDER — DOCUSATE SODIUM 100 MG PO CAPS: 100.0000 mg | ORAL_CAPSULE | Freq: Two times a day (BID) | ORAL | Status: AC | PRN

## 2012-04-29 MED ORDER — OXYCODONE-ACETAMINOPHEN 5-325 MG PO TABS: 1.0000 | ORAL_TABLET | Freq: Once | ORAL | Status: AC

## 2012-04-29 MED ORDER — TRAMADOL HCL 50 MG PO TABS
50.0000 mg | ORAL_TABLET | Freq: Four times a day (QID) | ORAL | Status: AC | PRN
Start: 2012-04-29 — End: 2012-05-09

## 2012-04-29 MED ORDER — DIAZEPAM 5 MG PO TABS
5.0000 mg | ORAL_TABLET | Freq: Once | ORAL | Status: AC
  Administered 2012-04-29: 5 mg via ORAL
  Filled 2012-04-29: qty 1

## 2012-04-29 NOTE — ED Notes (Signed)
Patient with 5 day history of lower back pain.  Patient states he has had this before.  Pain before and after urination.

## 2012-04-29 NOTE — Discharge Instructions (Signed)
Prostate Problems The prostate gland is part of the reproductive system of men. A normal prostate is about the size and shape of a walnut. The prostate may grow as a man ages. The gland makes a fluid that is mixed with sperm to make semen. This gland is located in front of the rectum and just below the bladder, where urine is stored. The prostate surrounds the urethra. The urethra is the tube through which urine passes out of the body. COMMON PROSTATE PROBLEMS  Prostatitis   The most common prostate problem in men under 50 is inflammation of the prostate gland (prostatitis).   This is generally an infection that enters the prostate from the urethra.   It may be sexually transmitted.   It could be caused by a slow growing cancer.   If not caused by cancer, treatment with antibiotics is usually very effective. In some cases, symptoms may be slow to go away and may come back. This condition is commonly called chronic prostatitis.   Benign Prostatic Hypertrophy (BPH)   BPH is an enlarged prostate.   There is no cancer present with this condition.   The exact cause is not known; but it is one of the most common problems for men over age 25.   If your caregiver finds BPH, but there are no symptoms or mild symptoms, you may need examinations once or twice a year.   Prostate cancer   Symptoms of prostate cancer will vary depending on how big the tumor is and whether it has spread beyond the prostate. If it has spread, your caregiver must find out how far it has spread.   Prostate cancer is treated by various combinations of surgery, radiation therapy, hormonal therapy, and chemotherapy. Sometimes the prostate cancer is just observed to determine the need for treatment.   Some men with enlarged prostates have no symptoms at all.   Symptoms vary a lot. They are usually referred to as "lower urinary tract symptoms" (LUTS).  SYMPTOMS   Frequent urination.   Getting up often during the  night to urinate.   A feeling that you still have a full bladder after passing your urine.   A weak stream or dribbling after urination.   Having to push or strain to pass your urine.   Fever.   Pain in the low back or groin.   Blood in the urine.   Discharge from the penis.   Weight loss.   There may be visible enlargement of the bladder.   In severe cases, you may not be able to empty your bladder and there is severe pain. This is an emergency and requires immediate medical care. If this occurs many times, you can develop permanent damage to the bladder and kidneys.   Different prostate problems may have similar symptoms. In the early stages, there may be no symptoms at all.  DIAGNOSIS   If you have urination problems, or a digital rectal exam (DRE) or prostate-specific antigen (PSA) test indicates that you might have a problem, additional tests will be suggested.   Ask your caregiver if any special preparations are needed before your diagnostic tests.  TREATMENT   If your caregiver finds BPH and you have bothersome symptoms, medications can be taken by mouth to help.   If medications are not helpful, surgery may be advised. Different procedures use different methods to heat, destroy, and remove a small amount of the prostate tissue. These methods include the use of:   Microwaves.  High frequency sound waves.   A laser.   An Teaching laboratory technician.   Other surgeries are available. All of these are preceded by appropriate anesthesia.   The surgeon scrapes away part of the inside of the prostate using a small scope put into the urethra. This reduces the squeezing on the urethra.   The surgeon makes small cuts in the prostate to reduce the squeezing pressure on the urethra.   Removal of the entire prostate is carried out through a small incision.   Removal of the entire prostate through a larger incision may occur in situations where the surgeon feels the other operations  are not appropriate.  TYPES OF TESTS DRE  You may be asked to bend over a table or to lie on your side holding your knees close to your chest. Your caregiver advances a gloved, lubricated finger into the rectum and feels the part of the prostate that lies next to it. You may find the DRE slightly uncomfortable, but it is very brief.   This exam tells the caregiver whether the gland has any bumps, irregularities, or soft or hard spots that require additional tests.   If a prostate infection is suspected, the caregiver might press the prostate during the DRE to obtain fluid for examination under a microscope.  PSA Blood Test  The amount of PSA, a protein produced by prostate cells, is often higher in the blood of men who have prostate cancer. However, an elevated level of PSA does not necessarily mean you have cancer.   Healthy men should no longer receive PSA blood tests as part of routine cancer screening. Consult with your caregiver about prostate cancer screening.  Urinalysis  This can help find an infection if one is present.  Transrectal Ultrasound and Prostate Biopsy  If prostate cancer is suspected, your caregiver may recommend a transrectal ultrasound.   A probe is inserted into the rectum. The probe directs high frequency sound waves at the prostate, and the created image is visible on a monitor screen.   The image shows the size of the prostate and any abnormalities. This cannot clearly identify tumors.   To determine whether an abnormality is a tumor, the caregiver may use the probe and the ultrasound images to guide a biopsy needle to the abnormality. Prostate tissue samples will be collected for examination under a microscope. A specialist will look at the tissue samples to see if cancer is present.  MRI and CT Scans  MRI and CT scans both use computers to create images of internal organs.   These tests can help identify abnormal structures.   They cannot show a difference  between cancerous tumors and noncancerous growths.   If a biopsy confirms cancer, your caregiver might use these imaging techniques to determine how far the cancer has spread. In most cases, these tests are not required. Your caregiver will discuss the need for these tests if he or she feels they are indicated.  Urodynamic Tests  A weak stream of urine and difficulty emptying the bladder fully may be signs of urine blockage caused by an enlarged prostate that is squeezing the urethra.   If your problem appears to be related to a blockage, your caregiver may recommend tests that measure bladder pressure and urine flow rate.   You may be asked to urinate into a device that measures how quickly the urine is flowing. It will record how many seconds it takes for the peak flow rate to be reached.  Another test measures post-void residual. This is the amount of urine left in your bladder when you have finished passing urine.  Intravenous Pyelogram (IVP)   IVP is an X-ray of the urinary tract.   In this test, a fluid (contrast) is injected into a vein. X-ray pictures are taken at different times to see the progression of contrast through the kidney and ureter.   The contrast makes the urine visible on the X-ray and shows any narrowing or blockage in the urinary tract.   This procedure can help show problems in the kidneys, ureters, or bladder that may have come from urine retention or backup.  Abdominal Ultrasound  For an abdominal ultrasound exam, gel will be applied to your lower abdomen. A handheld device will be moved across the lower abdomen to record a picture of your entire urinary tract.   An abdominal ultrasound can show damage in the upper urinary tract that comes from urine blockage.  Cystoscopy  A solution will numb the inside of the penis. A small tube (cystoscope) is inserted through the urethral opening at the tip of the penis.   The tube allows your caregiver to see the inside  of the urethra and bladder.   The caregiver can determine the location and amount of the obstruction causing problems.  Finding out the results of your test Not all test results are available during your visit. If your test results are not back during the visit, make an appointment with your caregiver to find out the results. Do not assume everything is normal if you have not heard from your caregiver or the medical facility. It is important for you to follow up on all of your test results. HOME CARE INSTRUCTIONS  Home care instructions after diagnostic testing will vary dependent upon the procedure performed.  Care after urodynamic tests or a cystoscopy:   You may have mild discomfort for a few hours.   Drinking two, 8 ounce (240 mL) glasses of water each hour, for 2 hours should help.   Ask your caregiver whether you can take a warm bath. If not, you may be able to hold a warm, damp washcloth over the urethral opening to relieve the discomfort.   Care after a prostate biopsy:   A prostate biopsy may produce pain in the area of the rectum and the area between the rectum and the scrotum (the perineum).   Only take over-the-counter or prescription medications for pain, discomfort, or fever as directed by your caregiver.   You may be given antibiotics to prevent an infection.  SEEK MEDICAL CARE IF:  You have any sign of an infection including pain with urination, chills, or fever.  FOR MORE INFORMATION American Foundation for Urologic Disease: www.urologyhealth.org [employer] ([employer]): GemRingtones.nl The [employer] of Diabetes and Digestive and Kidney Diseases (NIDDK): CarFlippers.tn The Prostatitis Foundation: www.prostatitis.org Document Released: 09/21/2007 Document Revised: 11/13/2011 Document Reviewed: 06/08/2009 [facility] Patient Information 2012 [place], [place].Dysuria You have dysuria. This is pain on urination. Dysuria is often present with other  symptoms such as: A sudden urge to go.  Having to go more often.  Dysuria can be caused by: Urinary tract infections.  Yeast infections.  Prostate problems.  Urinary stones.  Sexually transmitted diseases.  Lab tests of the urine will usually be needed to confirm a urinary infection. An infection is the cause of dysuria in over half the cases. In older men the prostate gland enlarges and can cause urinary problems. These include:  Urinary obstruction.  Infection.  Pain on urination.  Bladder cancer can also cause blood in the urine and dysuria. If you have an infection, be sure to take the antibiotics prescribed for you until they are gone. This will help prevent a recurrence. Further checking by a specialist may be needed if the cause of your dysuria is not found. Cystoscopy, x-rays, pelvic exams, and special cultures may be needed to find the cause and help find the best treatment. See your caregiver right away if your symptoms are not improved after three days.  SEEK IMMEDIATE MEDICAL CARE IF:  You have difficulty urinating, pass bloody urine, or have chills or a fever. Document Released: 11/24/2005 Document Revised: 11/13/2011 Document Reviewed: 05/11/2007 [facility] Patient Information 2012 [place], [place]. You have symptomatic enlarged prostate.  Please make an appointment with Dr. Brunilda Payor with urology for further evaluation.  At this time, your urine, is clear, indicating no infection.  You have been given a medication for discomfort.  Please continue taking your Flomax.    You may need surgery at some point to relieve your  symptoms.  This cannot be done in the emergency department

## 2012-04-29 NOTE — ED Provider Notes (Signed)
History     CSN: [id number]  Arrival date & time 04/29/12  1933   First MD Initiated Contact with Patient 04/29/12 2106      Chief Complaint  Patient presents with  . Back Pain    (Consider location/radiation/quality/duration/timing/severity/associated sxs/prior treatment) HPI Comments: Patient has been seen several times for his symptoms of dysuria, without signs of infection.  He was diagnosed with prostatitis.  He never filled the antibiotic.  He has not followed up with urology as he does not have insurance nor primary care provider.  He is now presenting with 5 days of dysuria and Left back pain  Patient is a 51 y.o. male presenting with back pain. The history is provided by the patient.  Back Pain  This is a recurrent problem. The current episode started more than 2 days ago. The problem occurs constantly. The problem has not changed since onset.The pain is associated with no known injury. The pain does not radiate. The pain is at a severity of 5/10. The pain is moderate. The symptoms are aggravated by certain positions. Associated symptoms include dysuria. Pertinent negatives include no fever, no leg pain and no weakness.    Past Medical History  Diagnosis Date  . Hypercholesteremia   . Hypertension   . Prostatitis   . Chronic back pain   . Hx of chlamydia infection   . Incidental lung nodule, > 3mm and < 8mm     Past Surgical History  Procedure Date  . Testicle removal   . Bunionectomy     No family history on file.  History  Substance Use Topics  . Smoking status: Current Everyday Smoker    Types: Cigarettes  . Smokeless tobacco: Not on file  . Alcohol Use: No      Review of Systems  Constitutional: Negative for fever.  Genitourinary: Positive for dysuria and frequency. Negative for flank pain.  Musculoskeletal: Positive for back pain.  Neurological: Negative for dizziness and weakness.    Allergies  Review of patient's allergies indicates no known  allergies.  Home Medications   Current Outpatient Rx  Name Route Sig Dispense Refill  . TAMSULOSIN HCL 0.4 MG PO CAPS Oral Take 0.4 mg by mouth daily.       BP 139/94  Pulse 78  Temp(Src) 97.9 F (36.6 C) (Oral)  Resp 17  SpO2 98%  Physical Exam  Constitutional: He is oriented to person, place, and time. He appears well-developed and well-nourished.  HENT:  Head: Normocephalic.  Eyes: Pupils are equal, round, and reactive to light.  Neck: Normal range of motion.  Cardiovascular: Normal rate.   Pulmonary/Chest: He is in respiratory distress.  Abdominal: Soft. Bowel sounds are normal. He exhibits no distension. There is no tenderness.  Genitourinary: Rectum normal and penis normal. Guaiac negative stool. Prostate is tender. No penile tenderness.  Musculoskeletal: Normal range of motion. He exhibits tenderness.       Lumbar back: He exhibits tenderness and pain. He exhibits no bony tenderness, no swelling, no edema and no spasm.       Back:  Neurological: He is alert and oriented to person, place, and time.  Skin: Skin is warm. No rash noted. No erythema.    ED Course  Procedures (including critical care time)  Labs Reviewed  URINALYSIS, ROUTINE W REFLEX MICROSCOPIC - Abnormal; Notable for the following:    Hgb urine dipstick TRACE (*)    All other components within normal limits  POCT I-STAT, CHEM 8  URINE MICROSCOPIC-ADD ON   No results found.   No diagnosis found.  Patient returned demanding pain medication   Given Ultram   MDM  Again, Mr. no his urine is negative for any signs of infection.  He does have a tender muscle in his low back.  I will check kidney function, I feel that this is just an exacerbation of his chronic back pain.  He does have a history of prostatitis.  I will check his prostate for any tenderness.        Arman Filter, NP 04/29/12 2326

## 2012-04-30 NOTE — ED Provider Notes (Signed)
Medical screening examination/treatment/procedure(s) were performed by non-physician practitioner and as supervising physician I was immediately available for consultation/collaboration.  Sloka Volante L Ysabela Keisler, MD 04/30/12 1617 

## 2012-06-24 ENCOUNTER — Encounter (HOSPITAL_COMMUNITY): Payer: Self-pay | Admitting: *Deleted

## 2012-06-24 ENCOUNTER — Emergency Department (HOSPITAL_COMMUNITY)
Admission: EM | Admit: 2012-06-24 | Discharge: 2012-06-24 | Disposition: A | Discharge status: Home / Self Care | Payer: No Typology Code available for payment source | Attending: Emergency Medicine | Admitting: Emergency Medicine

## 2012-06-24 DIAGNOSIS — E7800 Pure hypercholesterolemia, unspecified: Secondary | ICD-10-CM | POA: Insufficient documentation

## 2012-06-24 DIAGNOSIS — I10 Essential (primary) hypertension: Secondary | ICD-10-CM | POA: Insufficient documentation

## 2012-06-24 DIAGNOSIS — IMO0002 Reserved for concepts with insufficient information to code with codable children: Secondary | ICD-10-CM | POA: Insufficient documentation

## 2012-06-24 DIAGNOSIS — F419 Anxiety disorder, unspecified: Secondary | ICD-10-CM

## 2012-06-24 DIAGNOSIS — F411 Generalized anxiety disorder: Secondary | ICD-10-CM | POA: Insufficient documentation

## 2012-06-24 DIAGNOSIS — T148XXA Other injury of unspecified body region, initial encounter: Secondary | ICD-10-CM

## 2012-06-24 DIAGNOSIS — F17200 Nicotine dependence, unspecified, uncomplicated: Secondary | ICD-10-CM | POA: Insufficient documentation

## 2012-06-24 DIAGNOSIS — M549 Dorsalgia, unspecified: Secondary | ICD-10-CM | POA: Insufficient documentation

## 2012-06-24 DIAGNOSIS — G8929 Other chronic pain: Secondary | ICD-10-CM | POA: Insufficient documentation

## 2012-06-24 MED ORDER — IBUPROFEN 800 MG PO TABS
800.0000 mg | ORAL_TABLET | Freq: Once | ORAL | Status: AC
  Administered 2012-06-24: 800 mg via ORAL
  Filled 2012-06-24: qty 1

## 2012-06-24 MED ORDER — IBUPROFEN 800 MG PO TABS: 800.0000 mg | ORAL_TABLET | Freq: Three times a day (TID) | ORAL | Status: DC
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
## 2012-06-25 NOTE — ED Provider Notes (Signed)
Medical screening examination/treatment/procedure(s) were performed by non-physician practitioner and as supervising physician I was immediately available for consultation/collaboration.   Celene Kras, MD 06/25/12 [phone]

## 2012-07-01 ENCOUNTER — Encounter (HOSPITAL_COMMUNITY): Payer: Self-pay | Admitting: Emergency Medicine

## 2012-07-01 ENCOUNTER — Emergency Department (HOSPITAL_COMMUNITY)
Admission: EM | Admit: 2012-07-01 | Discharge: 2012-07-01 | Disposition: A | Discharge status: Home / Self Care | Payer: No Typology Code available for payment source | Attending: Emergency Medicine | Admitting: Emergency Medicine

## 2012-07-01 DIAGNOSIS — F419 Anxiety disorder, unspecified: Secondary | ICD-10-CM

## 2012-07-01 DIAGNOSIS — F411 Generalized anxiety disorder: Secondary | ICD-10-CM | POA: Insufficient documentation

## 2012-07-01 DIAGNOSIS — G8929 Other chronic pain: Secondary | ICD-10-CM | POA: Insufficient documentation

## 2012-07-01 DIAGNOSIS — I10 Essential (primary) hypertension: Secondary | ICD-10-CM | POA: Insufficient documentation

## 2012-07-01 DIAGNOSIS — F17200 Nicotine dependence, unspecified, uncomplicated: Secondary | ICD-10-CM | POA: Insufficient documentation

## 2012-07-01 DIAGNOSIS — E7800 Pure hypercholesterolemia, unspecified: Secondary | ICD-10-CM | POA: Insufficient documentation

## 2012-07-01 MED ORDER — ALPRAZOLAM 0.5 MG PO TABS: 0.5000 mg | ORAL_TABLET | Freq: Three times a day (TID) | ORAL | Status: AC | PRN

## 2012-07-01 MED ORDER — ALPRAZOLAM ER 0.5 MG PO TB24: 0.5000 mg | ORAL_TABLET | Freq: Every day | ORAL | Status: DC

## 2012-07-01 MED ORDER — ALPRAZOLAM 0.25 MG PO TABS
0.5000 mg | ORAL_TABLET | Freq: Once | ORAL | Status: AC
  Administered 2012-07-01: 0.5 mg via ORAL
  Filled 2012-07-01: qty 2
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
## 2012-07-01 NOTE — ED Provider Notes (Signed)
History     CSN: [id number]  Arrival date & time 07/01/12  1739   First MD Initiated Contact with Patient 07/01/12 2105      Chief Complaint  Patient presents with  . Anxiety    HPI Pt complaining of conflict with neighbor causing stress. Pt states that he has called [employer] 2x today on neighbor. Pt states that he feels stressed out  Past Medical History  Diagnosis Date  . Hypercholesteremia   . Hypertension   . Prostatitis   . Chronic back pain   . Hx of chlamydia infection   . Incidental lung nodule, > 3mm and < 8mm     Past Surgical History  Procedure Date  . Testicle removal   . Bunionectomy     No family history on file.  History  Substance Use Topics  . Smoking status: Current Everyday Smoker    Types: Cigarettes  . Smokeless tobacco: Not on file  . Alcohol Use: No      Review of Systems  All other systems reviewed and are negative.    Allergies  Review of patient's allergies indicates no known allergies.  Home Medications   Current Outpatient Rx  Name Route Sig Dispense Refill  . TAMSULOSIN HCL 0.4 MG PO CAPS Oral Take 0.4 mg by mouth every morning.     [name] ALPRAZOLAM 0.5 MG PO TABS Oral Take 1 tablet (0.5 mg total) by mouth 3 (three) times daily as needed for sleep. 60 tablet 0    BP 116/72  Pulse 72  Temp 98.2 F (36.8 C) (Oral)  Resp 20  SpO2 100%  Physical Exam  Nursing note and vitals reviewed. Constitutional: He is oriented to person, place, and time. He appears well-developed. No distress.  HENT:  Head: Normocephalic and atraumatic.  Eyes: Pupils are equal, round, and reactive to light.  Neck: Normal range of motion.  Cardiovascular: Normal rate and intact distal pulses.   Pulmonary/Chest: No respiratory distress. He has no wheezes.  Abdominal: Normal appearance. He exhibits no distension. There is no tenderness. There is no rebound.  Musculoskeletal: Normal range of motion.  Neurological: He is alert and oriented to person, place,  and time. No cranial nerve deficit.  Skin: Skin is warm and dry. No rash noted.  Psychiatric: He has a normal mood and affect. His behavior is normal.    ED Course  Procedures (including critical care time)  Labs Reviewed - No data to display No results found.   1. Anxiety       MDM         Nelia Shi, MD 07/04/12 2256

## 2012-07-01 NOTE — ED Notes (Addendum)
Pt complaining of conflict with neighbor causing stress. Pt states that he has called [employer] 2x today on neighbor. Pt states that he feels stressed out

## 2012-08-05 ENCOUNTER — Emergency Department (HOSPITAL_COMMUNITY)
Admission: EM | Admit: 2012-08-05 | Discharge: 2012-08-06 | Disposition: A | Discharge status: Home / Self Care | Payer: Self-pay | Attending: Emergency Medicine | Admitting: Emergency Medicine

## 2012-08-05 ENCOUNTER — Encounter (HOSPITAL_COMMUNITY): Payer: Self-pay | Admitting: *Deleted

## 2012-08-05 DIAGNOSIS — G8929 Other chronic pain: Secondary | ICD-10-CM | POA: Insufficient documentation

## 2012-08-05 DIAGNOSIS — N419 Inflammatory disease of prostate, unspecified: Secondary | ICD-10-CM | POA: Insufficient documentation

## 2012-08-05 DIAGNOSIS — I10 Essential (primary) hypertension: Secondary | ICD-10-CM | POA: Insufficient documentation

## 2012-08-05 DIAGNOSIS — F17200 Nicotine dependence, unspecified, uncomplicated: Secondary | ICD-10-CM | POA: Insufficient documentation

## 2012-08-05 DIAGNOSIS — E7800 Pure hypercholesterolemia, unspecified: Secondary | ICD-10-CM | POA: Insufficient documentation

## 2012-08-05 NOTE — ED Notes (Signed)
Pt reports pain in lower back, testicles and hips. Pt denies fever, n/v/d. Pt reports running out of Flomax two days ago. Pt reports difficulty urinating with pain. Pt's last BM was three days ago.

## 2012-08-06 LAB — URINE MICROSCOPIC-ADD ON

## 2012-08-06 LAB — URINALYSIS, ROUTINE W REFLEX MICROSCOPIC
Bilirubin Urine: NEGATIVE
Nitrite: NEGATIVE
Protein, ur: 30 mg/dL — AB
Specific Gravity, Urine: 1.031 — ABNORMAL HIGH (ref 1.005–1.030)
Urobilinogen, UA: 0.2 mg/dL (ref 0.0–1.0)

## 2012-08-06 MED ORDER — DOCUSATE SODIUM 100 MG PO CAPS: 100.0000 mg | ORAL_CAPSULE | Freq: Two times a day (BID) | ORAL | Status: AC

## 2012-08-06 MED ORDER — KETOROLAC TROMETHAMINE 60 MG/2ML IM SOLN
60.0000 mg | Freq: Once | INTRAMUSCULAR | Status: AC
  Administered 2012-08-06: 60 mg via INTRAMUSCULAR
  Filled 2012-08-06: qty 2

## 2012-08-06 MED ORDER — TAMSULOSIN HCL 0.4 MG PO CAPS: 0.4000 mg | ORAL_CAPSULE | Freq: Every day | ORAL | Status: DC

## 2012-08-06 MED ORDER — HYDROCODONE-ACETAMINOPHEN 5-500 MG PO TABS: 1.0000 | ORAL_TABLET | Freq: Four times a day (QID) | ORAL | Status: AC | PRN

## 2012-08-06 MED ORDER — TAMSULOSIN HCL 0.4 MG PO CAPS
0.4000 mg | ORAL_CAPSULE | Freq: Once | ORAL | Status: AC
  Administered 2012-08-06: 0.4 mg via ORAL
  Filled 2012-08-06: qty 1

## 2012-08-06 MED ORDER — CIPROFLOXACIN HCL 500 MG PO TABS: 500.0000 mg | ORAL_TABLET | Freq: Two times a day (BID) | ORAL | Status: AC

## 2012-08-06 NOTE — ED Provider Notes (Signed)
History     CSN: [id number]  Arrival date & time 08/05/12  2250   First MD Initiated Contact with Patient 08/06/12 0105      Chief Complaint  Patient presents with  . Back Pain  . Testicle Pain    (Consider location/radiation/quality/duration/timing/severity/associated sxs/prior treatment) HPI Comments: 51 year old male with a history of prostatitis, prostatic hypertrophy, hypertension and chronic back pain. He has had to have a testicle removed in the past secondary to pain, the patient denies that this was secondary to cancer. He states that over the last 2 days he has developed pain in his left lower back, left groin which is associated with intermittent constipation. He has no blood in the stools, he has to push to urinate however he states that he ran out of Flomax 2-3 days ago as well. He denies fevers chills nausea vomiting or diarrhea. He states he has had these symptoms in the past. He thinks he may have had prostate infections in the past.  Patient is a 51 y.o. male presenting with back pain and testicular pain. The history is provided by the patient.  Back Pain   Testicle Pain    Past Medical History  Diagnosis Date  . Hypercholesteremia   . Hypertension   . Prostatitis   . Chronic back pain   . Hx of chlamydia infection   . Incidental lung nodule, > 3mm and < 8mm     Past Surgical History  Procedure Date  . Testicle removal   . Bunionectomy     No family history on file.  History  Substance Use Topics  . Smoking status: Current Everyday Smoker    Types: Cigarettes  . Smokeless tobacco: Never Used  . Alcohol Use: No      Review of Systems  Genitourinary: Positive for testicular pain.  Musculoskeletal: Positive for back pain.  All other systems reviewed and are negative.    Allergies  Review of patient's allergies indicates no known allergies.  Home Medications   Current Outpatient Rx  Name Route Sig Dispense Refill  . TAMSULOSIN HCL 0.4 MG  PO CAPS Oral Take 0.4 mg by mouth every morning.     [name] CIPROFLOXACIN HCL 500 MG PO TABS Oral Take 1 tablet (500 mg total) by mouth every 12 (twelve) hours. 60 tablet 0  . DOCUSATE SODIUM 100 MG PO CAPS Oral Take 1 capsule (100 mg total) by mouth every 12 (twelve) hours. 30 capsule 0  . HYDROCODONE-ACETAMINOPHEN 5-500 MG PO TABS Oral Take 1-2 tablets by mouth every 6 (six) hours as needed for pain. 15 tablet 0  . TAMSULOSIN HCL 0.4 MG PO CAPS Oral Take 1 capsule (0.4 mg total) by mouth daily. 30 capsule 1    BP 144/82  Pulse 67  Temp 97.6 F (36.4 C) (Oral)  Resp 24  Ht 5\' 7"  (1.702 m)  Wt 275 lb (124.739 kg)  BMI 43.07 kg/m2  SpO2 96%  Physical Exam  Nursing note and vitals reviewed. Constitutional: He appears well-developed and well-nourished. No distress.  HENT:  Head: Normocephalic and atraumatic.  Mouth/Throat: Oropharynx is clear and moist. No oropharyngeal exudate.  Eyes: Conjunctivae and EOM are normal. Pupils are equal, round, and reactive to light. Right eye exhibits no discharge. Left eye exhibits no discharge. No scleral icterus.  Neck: Normal range of motion. Neck supple. No JVD present. No thyromegaly present.  Cardiovascular: Normal rate, regular rhythm, normal heart sounds and intact distal pulses.  Exam reveals no gallop and no  friction rub.   No murmur heard. Pulmonary/Chest: Effort normal and breath sounds normal. No respiratory distress. He has no wheezes. He has no rales.  Abdominal: Soft. Bowel sounds are normal. He exhibits no distension and no mass. There is no tenderness.  Genitourinary:        Rectal exam shows mild tenderness to the prostate, no blood in the stool, normal appearing brown stool in the rectal vault. Absence of the left testicle, normal appearing penis and right testicle, normal appearing scrotum, no hernias palpated  Musculoskeletal: Normal range of motion. He exhibits no edema and no tenderness.  Lymphadenopathy:    He has no cervical  adenopathy.  Neurological: He is alert. Coordination normal.  Skin: Skin is warm and dry. No rash noted. No erythema.  Psychiatric: He has a normal mood and affect. His behavior is normal.    ED Course  Procedures (including critical care time)  Labs Reviewed  URINALYSIS, ROUTINE W REFLEX MICROSCOPIC - Abnormal; Notable for the following:    Specific Gravity, Urine 1.031 (*)     Hgb urine dipstick TRACE (*)     Protein, ur 30 (*)     All other components within normal limits  URINE MICROSCOPIC-ADD ON - Abnormal; Notable for the following:    Bacteria, UA FEW (*)     Crystals CA OXALATE CRYSTALS (*)     All other components within normal limits   No results found.   1. Prostatitis       MDM  Abdomen is benign, vital signs are normal and the urinalysis showed calcium oxalate crystals but no hemoglobin, slight proteinuria and elevated specific gravity. The patient is able to tolerate food and fluid without any nausea or vomiting. Will start treatment with Flomax and antibiotics for prostatitis. The patient is well appearing and amenable to discharge.  Discharge Prescriptions include:  cipro Hydrocodone Colace flomax         Vida Roller, MD 08/06/12 [phone]

## 2012-08-06 NOTE — ED Notes (Signed)
Pt is c/o pain in his lower back that starts around his spine area and comes around on the left side and down into his testicular area  Pt states pain started tonight  Pt states he thinks he has an infection of his prostate  Pt appears uncomfortable

## 2012-12-14 ENCOUNTER — Emergency Department (HOSPITAL_COMMUNITY): Payer: Self-pay

## 2012-12-14 ENCOUNTER — Emergency Department (HOSPITAL_COMMUNITY)
Admission: EM | Admit: 2012-12-14 | Discharge: 2012-12-14 | Disposition: A | Discharge status: Home / Self Care | Payer: Self-pay | Attending: Emergency Medicine | Admitting: Emergency Medicine

## 2012-12-14 ENCOUNTER — Encounter (HOSPITAL_COMMUNITY): Payer: Self-pay | Admitting: Emergency Medicine

## 2012-12-14 DIAGNOSIS — N390 Urinary tract infection, site not specified: Secondary | ICD-10-CM | POA: Insufficient documentation

## 2012-12-14 DIAGNOSIS — F17200 Nicotine dependence, unspecified, uncomplicated: Secondary | ICD-10-CM | POA: Insufficient documentation

## 2012-12-14 DIAGNOSIS — E7800 Pure hypercholesterolemia, unspecified: Secondary | ICD-10-CM | POA: Insufficient documentation

## 2012-12-14 DIAGNOSIS — M25539 Pain in unspecified wrist: Secondary | ICD-10-CM | POA: Insufficient documentation

## 2012-12-14 DIAGNOSIS — Z8709 Personal history of other diseases of the respiratory system: Secondary | ICD-10-CM | POA: Insufficient documentation

## 2012-12-14 DIAGNOSIS — I10 Essential (primary) hypertension: Secondary | ICD-10-CM | POA: Insufficient documentation

## 2012-12-14 DIAGNOSIS — M549 Dorsalgia, unspecified: Secondary | ICD-10-CM | POA: Insufficient documentation

## 2012-12-14 DIAGNOSIS — Z87448 Personal history of other diseases of urinary system: Secondary | ICD-10-CM | POA: Insufficient documentation

## 2012-12-14 DIAGNOSIS — R05 Cough: Secondary | ICD-10-CM | POA: Insufficient documentation

## 2012-12-14 DIAGNOSIS — Z8619 Personal history of other infectious and parasitic diseases: Secondary | ICD-10-CM | POA: Insufficient documentation

## 2012-12-14 DIAGNOSIS — G8929 Other chronic pain: Secondary | ICD-10-CM | POA: Insufficient documentation

## 2012-12-14 DIAGNOSIS — R059 Cough, unspecified: Secondary | ICD-10-CM | POA: Insufficient documentation

## 2012-12-14 DIAGNOSIS — M25519 Pain in unspecified shoulder: Secondary | ICD-10-CM | POA: Insufficient documentation

## 2012-12-14 DIAGNOSIS — Z79899 Other long term (current) drug therapy: Secondary | ICD-10-CM | POA: Insufficient documentation

## 2012-12-14 DIAGNOSIS — J069 Acute upper respiratory infection, unspecified: Secondary | ICD-10-CM

## 2012-12-14 DIAGNOSIS — M25569 Pain in unspecified knee: Secondary | ICD-10-CM | POA: Insufficient documentation

## 2012-12-14 MED ORDER — DIPHENHYDRAMINE HCL 25 MG PO CAPS: 25.0000 mg | ORAL_CAPSULE | Freq: Four times a day (QID) | ORAL | Status: DC | PRN

## 2012-12-14 MED ORDER — GUAIFENESIN 100 MG/5ML PO SYRP: 100.0000 mg | ORAL_SOLUTION | ORAL | Status: DC | PRN

## 2012-12-14 MED ORDER — IBUPROFEN 800 MG PO TABS: 800.0000 mg | ORAL_TABLET | Freq: Three times a day (TID) | ORAL | Status: DC

## 2012-12-14 NOTE — ED Provider Notes (Signed)
History     CSN: [id number]  Arrival date & time 12/14/12  1019   First MD Initiated Contact with Patient 12/14/12 1049      Chief Complaint  Patient presents with  . URI  . Wrist Pain  . Shoulder Pain  . Knee Pain    (Consider location/radiation/quality/duration/timing/severity/associated sxs/prior treatment) HPI Comments: Patient is a 52 year old male who presents with left shoulder and left wrist pain for the past 3 days. Pain started gradually and progressively worsened since the onset. Patient denies known injury. Pain is aching and severe and constant. Pain is made worse with movement of the affected joint. No alleviating factors. Patient did not try anything for symptom relief. No associated symptoms.   Patient also complains of URI symptoms including congestion and productive cough with yellow sputum for the past 3 days. Symptoms started gradually and progressively worsened since the onset. No aggravating/alleviating factors. Patient did not try anything for symptoms. No other symptoms.    Past Medical History  Diagnosis Date  . Hypercholesteremia   . Hypertension   . Prostatitis   . Chronic back pain   . Hx of chlamydia infection   . Incidental lung nodule, > 3mm and < 8mm     Past Surgical History  Procedure Date  . Testicle removal   . Bunionectomy     History reviewed. No pertinent family history.  History  Substance Use Topics  . Smoking status: Current Every Day Smoker    Types: Cigarettes  . Smokeless tobacco: Never Used  . Alcohol Use: No      Review of Systems  Respiratory: Positive for cough.   Musculoskeletal: Positive for arthralgias.  All other systems reviewed and are negative.    Allergies  Review of patient's allergies indicates no known allergies.  Home Medications   Current Outpatient Rx  Name  Route  Sig  Dispense  Refill  . OXYCODONE-ACETAMINOPHEN 10-325 MG PO TABS   Oral   Take 1 tablet by mouth every 6 (six) hours as  needed. For pain         . TAMSULOSIN HCL 0.4 MG PO CAPS   Oral   Take 1 capsule (0.4 mg total) by mouth daily.   30 capsule   1     BP 145/92  Pulse 80  Temp 98.3 F (36.8 C) (Oral)  Resp 18  SpO2 99%  Physical Exam  Nursing note and vitals reviewed. Constitutional: He appears well-developed and well-nourished. No distress.  HENT:  Head: Normocephalic and atraumatic.  Eyes: Conjunctivae normal and EOM are normal. Pupils are equal, round, and reactive to light. No scleral icterus.  Neck: Normal range of motion. Neck supple.  Cardiovascular: Normal rate and regular rhythm.  Exam reveals no gallop and no friction rub.   No murmur heard. Pulmonary/Chest: Effort normal and breath sounds normal. He has no wheezes. He has no rales. He exhibits no tenderness.  Abdominal: Soft. There is no tenderness.  Musculoskeletal: Normal range of motion.       Left shoulder and left wrist tender to palpation. No obvious deformity.   Neurological: He is alert.       Speech is goal-oriented. Moves limbs without ataxia.   Skin: Skin is warm and dry. He is not diaphoretic.  Psychiatric: He has a normal mood and affect. His behavior is normal.    ED Course  Procedures (including critical care time)  Labs Reviewed - No data to display Dg Wrist Complete Left  12/14/2012  *RADIOLOGY REPORT*  Clinical Data: Left posterior wrist pain for 2 days, no known injury  LEFT WRIST - COMPLETE 3+ VIEW  Comparison: None.  Findings: No definite fracture or dislocation.  There is a tiny focus of ossification off of the proximal ulnar aspect of the lunate with underlying cortical defect.  This likely represents a normal variant consisting of an ununited ossicle.  IMPRESSION: Minimal irregularity of the proximal ulnar aspect of the lunate. This appears more consistent with a normal variant than a fracture. No convincing fracture identified.  Consider x-ray follow-up in 1-2 weeks if symptoms persist.   Original Report  Authenticated By: Esperanza Heir, M.D.    Dg Shoulder Left  12/14/2012  *RADIOLOGY REPORT*  Clinical Data: Shoulder pain for 2 days, no known injury  LEFT SHOULDER - 2+ VIEW  Comparison: 01/06/2012 CT thorax  Findings: No fracture or dislocation.  Mild deformity of posterior lateral left seventh rib with no fracture line identified. This was present on CT scan performed 01/06/2012.  IMPRESSION: No evidence of acute shoulder abnormality.   Original Report Authenticated By: Esperanza Heir, M.D.      1. URI (upper respiratory infection)   2. Wrist pain       MDM  11:52 AM Xrays show no acute fractures or abnormalities. Patient will be discharged without further evaluation. I will prescribe an antihistamine for URI symptoms. Vitals stable. No further evaluation needed at this time.   Patient is unhappy with his prescriptions because he "would rather have narcotics." I will not prescribed the patient narcotics.        Emilia Beck, [place] 12/15/12 [phone]

## 2012-12-14 NOTE — ED Notes (Addendum)
Upon discharge pt states that he wants narcotic medication. Pt states that he "cannot work with out narcotics"

## 2012-12-14 NOTE — ED Notes (Signed)
Patient transported to X-ray 

## 2012-12-14 NOTE — ED Notes (Signed)
Pt c/o left shoulder, left wrist, and left knee pain x several days with productive cough with yellow sputum

## 2012-12-16 NOTE — ED Provider Notes (Signed)
Medical screening examination/treatment/procedure(s) were performed by non-physician practitioner and as supervising physician I was immediately available for consultation/collaboration.   Canaan Prue W Kinsly Hild, MD 12/16/12 0103 

## 2013-06-01 IMAGING — CR DG KNEE COMPLETE 4+V*L*
4 series · 4 of 4 positions shown · non-contrast
Comparison: 06/05/2010

CLINICAL DATA: Pain and swelling without trauma

LEFT KNEE - COMPLETE 4+ VIEW

[t knee ap left]
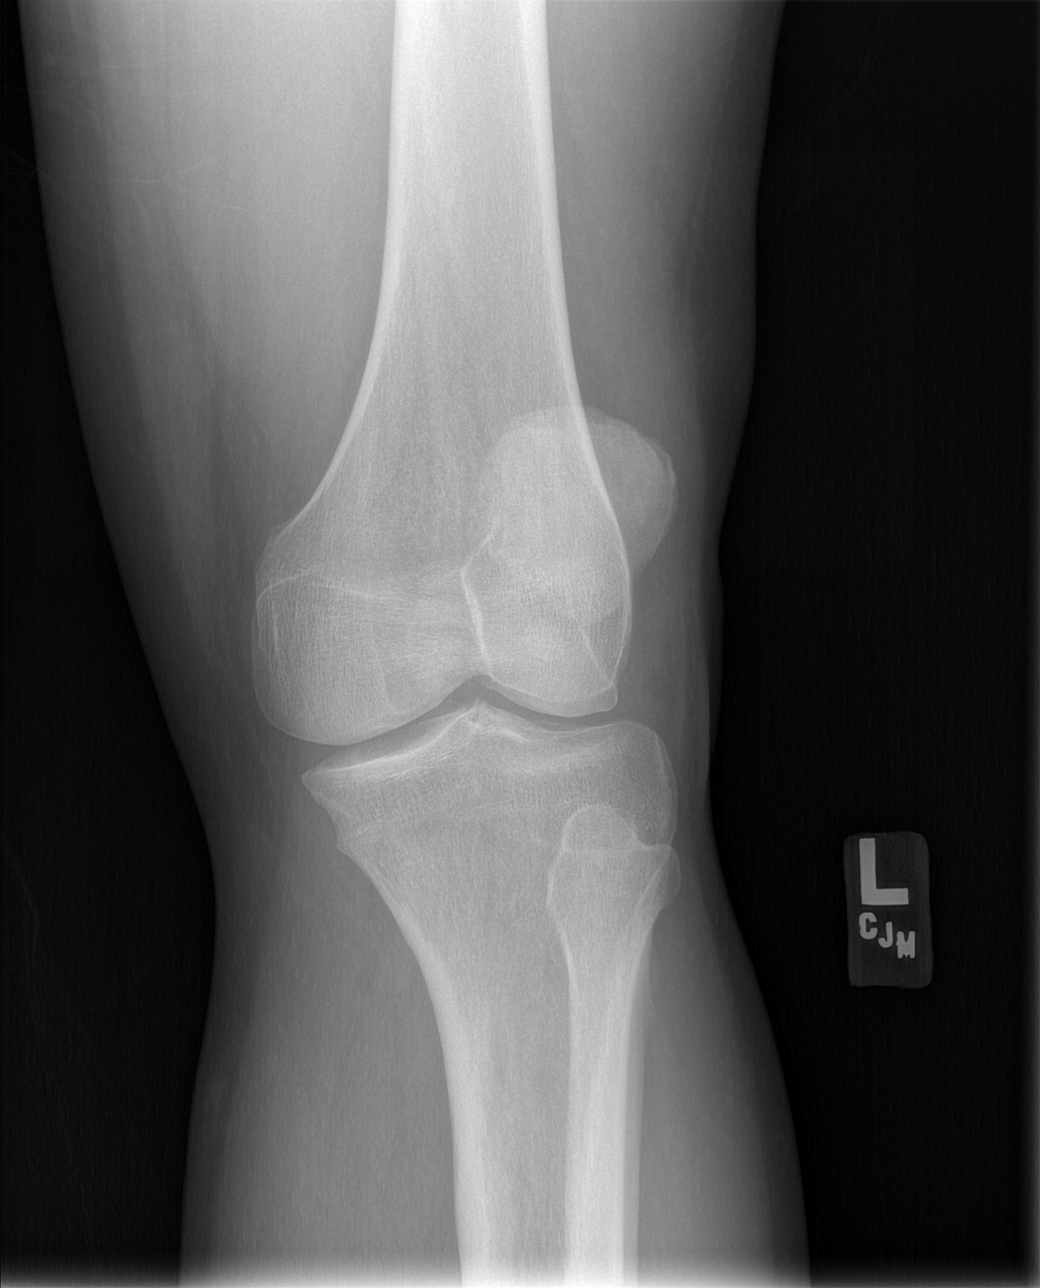

[t knee oblique left (1 of 2)]
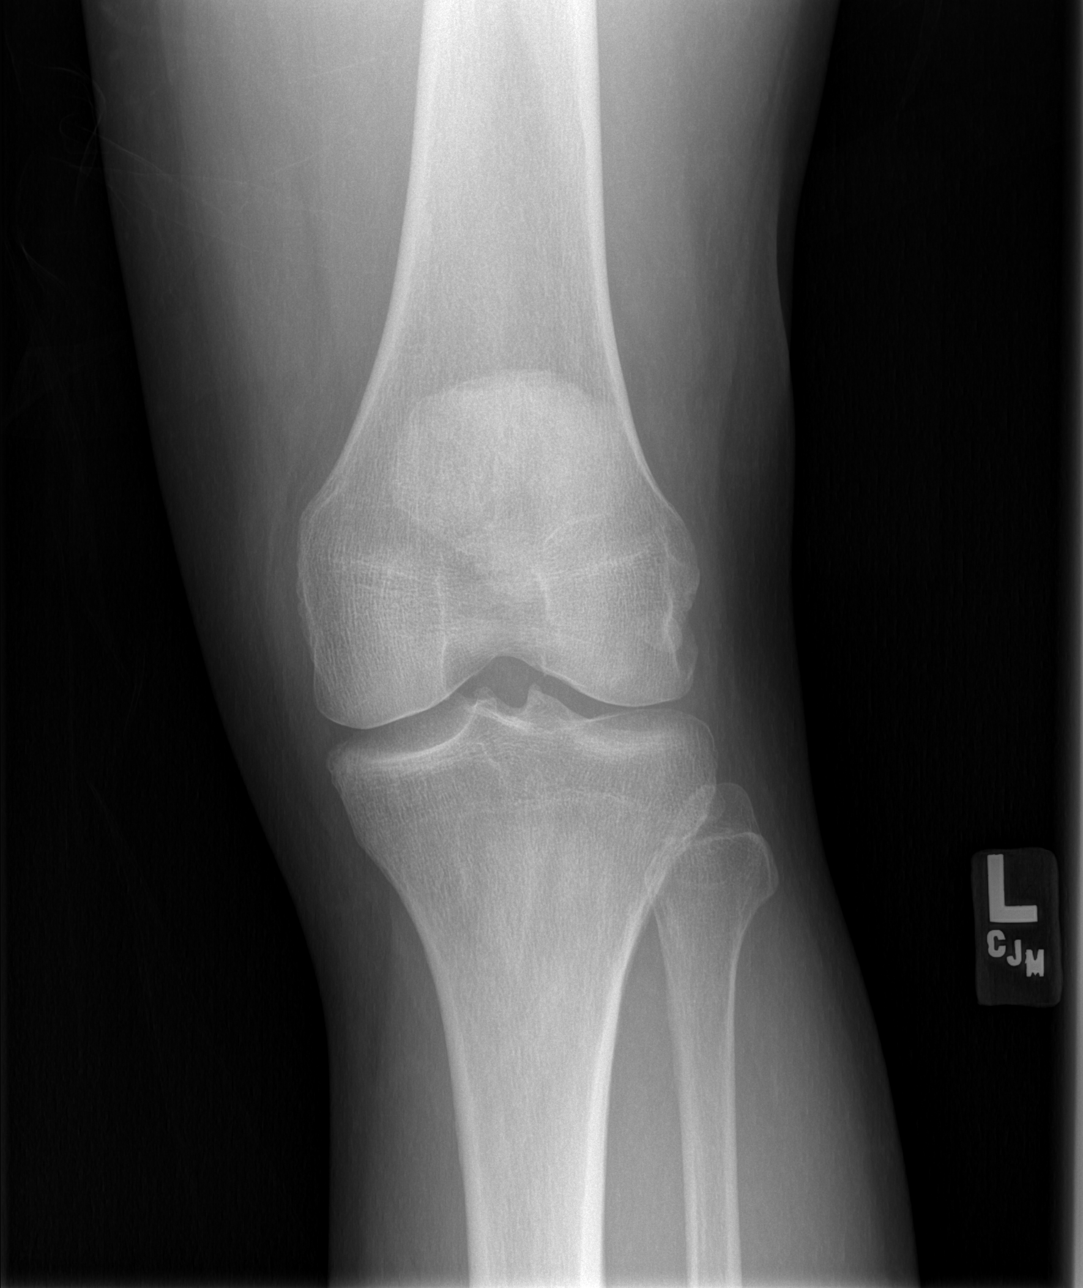

[t knee oblique left (2 of 2)]
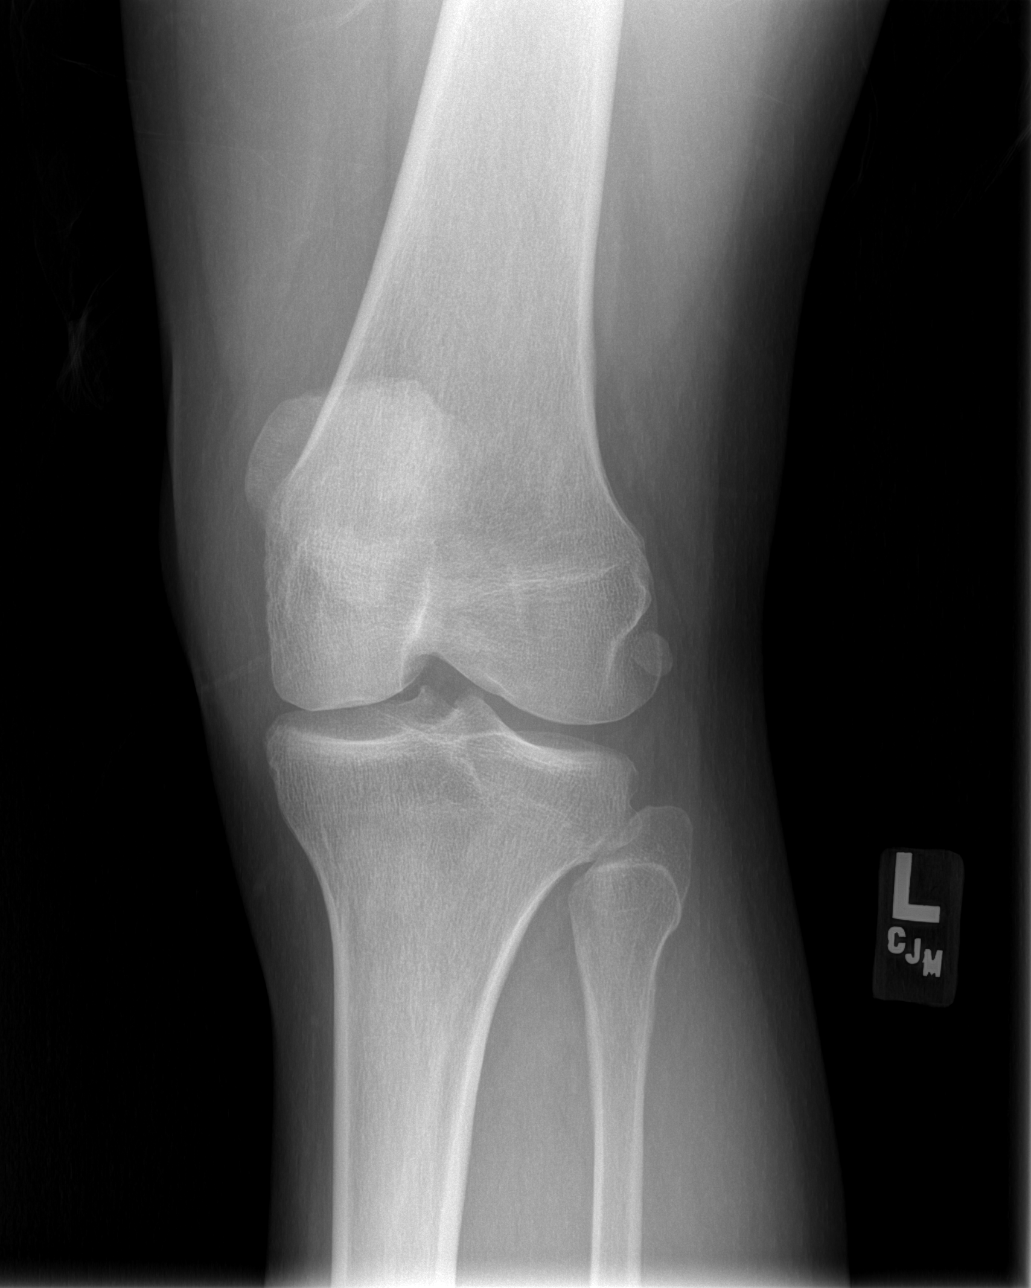

[t knee lat left]
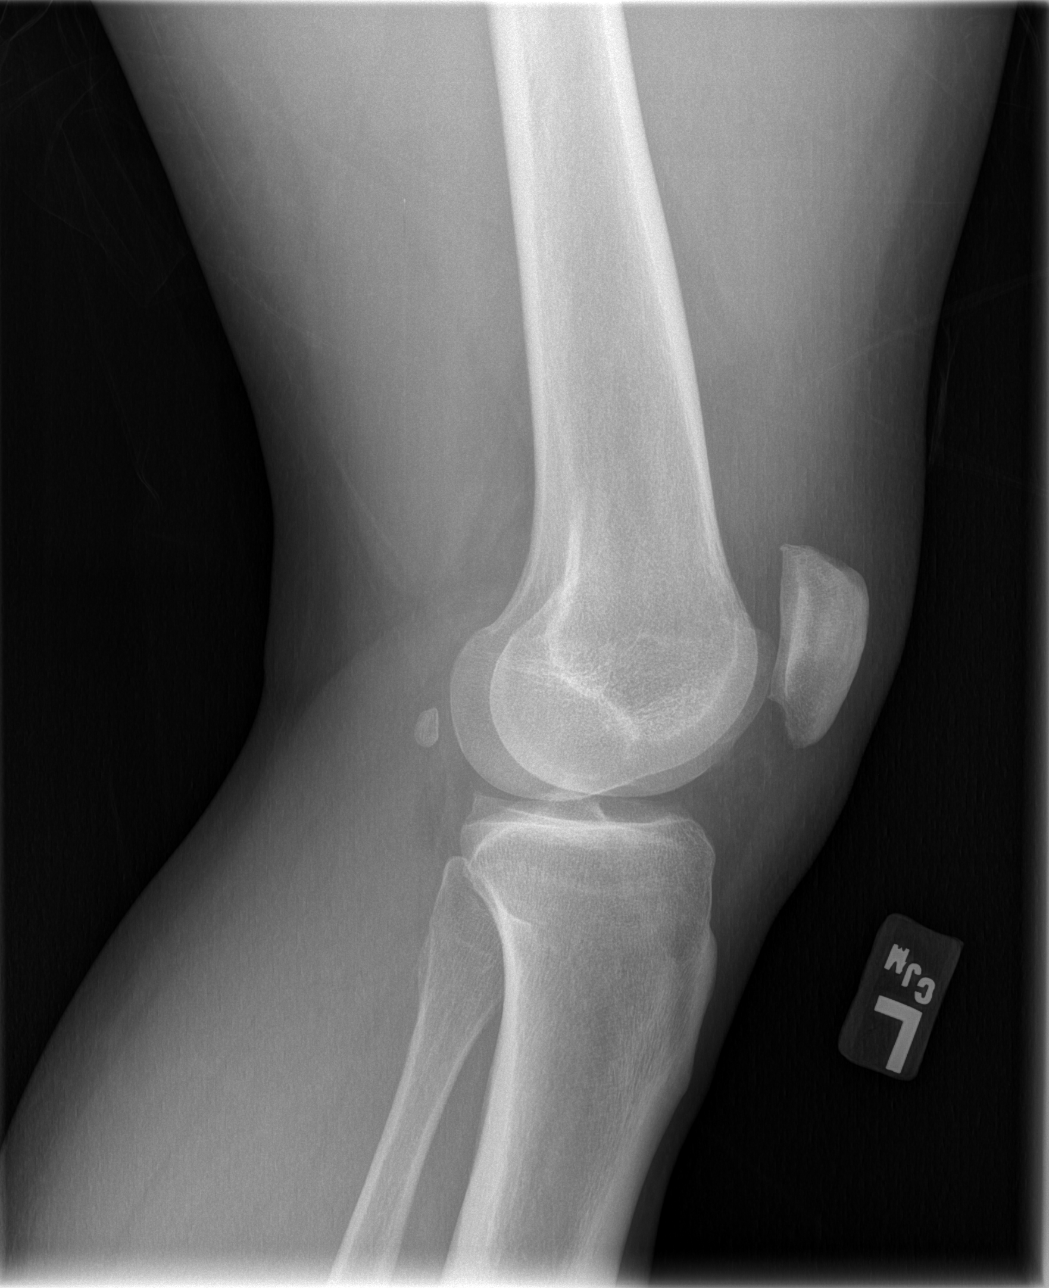

[4 of 4 positions shown; findings below may reference images not displayed]

FINDINGS: Small effusion in the suprapatellar bursa.  Small patellar spurs.
Negative for fracture, dislocation, or other acute bony
abnormality.  Normal mineralization and alignment.
IMPRESSION: 1.  Negative for fracture or other acute bone injury.
2.  Effusion.
3.  Small patellar spurs

## 2013-06-18 ENCOUNTER — Emergency Department (HOSPITAL_COMMUNITY)
Admission: EM | Admit: 2013-06-18 | Discharge: 2013-06-18 | Disposition: A | Discharge status: Home / Self Care | Payer: PRIVATE HEALTH INSURANCE | Attending: Emergency Medicine | Admitting: Emergency Medicine

## 2013-06-18 ENCOUNTER — Encounter (HOSPITAL_COMMUNITY): Payer: Self-pay | Admitting: Emergency Medicine

## 2013-06-18 DIAGNOSIS — K625 Hemorrhage of anus and rectum: Secondary | ICD-10-CM | POA: Insufficient documentation

## 2013-06-18 DIAGNOSIS — R209 Unspecified disturbances of skin sensation: Secondary | ICD-10-CM | POA: Insufficient documentation

## 2013-06-18 DIAGNOSIS — Z862 Personal history of diseases of the blood and blood-forming organs and certain disorders involving the immune mechanism: Secondary | ICD-10-CM | POA: Insufficient documentation

## 2013-06-18 DIAGNOSIS — Z79899 Other long term (current) drug therapy: Secondary | ICD-10-CM | POA: Insufficient documentation

## 2013-06-18 DIAGNOSIS — G8929 Other chronic pain: Secondary | ICD-10-CM | POA: Insufficient documentation

## 2013-06-18 DIAGNOSIS — M545 Low back pain, unspecified: Secondary | ICD-10-CM | POA: Insufficient documentation

## 2013-06-18 DIAGNOSIS — Z8639 Personal history of other endocrine, nutritional and metabolic disease: Secondary | ICD-10-CM | POA: Insufficient documentation

## 2013-06-18 DIAGNOSIS — Z8619 Personal history of other infectious and parasitic diseases: Secondary | ICD-10-CM | POA: Insufficient documentation

## 2013-06-18 DIAGNOSIS — F17200 Nicotine dependence, unspecified, uncomplicated: Secondary | ICD-10-CM | POA: Insufficient documentation

## 2013-06-18 DIAGNOSIS — I10 Essential (primary) hypertension: Secondary | ICD-10-CM | POA: Insufficient documentation

## 2013-06-18 DIAGNOSIS — Z8709 Personal history of other diseases of the respiratory system: Secondary | ICD-10-CM | POA: Insufficient documentation

## 2013-06-18 DIAGNOSIS — L02419 Cutaneous abscess of limb, unspecified: Secondary | ICD-10-CM | POA: Insufficient documentation

## 2013-06-18 DIAGNOSIS — L0291 Cutaneous abscess, unspecified: Secondary | ICD-10-CM

## 2013-06-18 DIAGNOSIS — Z9079 Acquired absence of other genital organ(s): Secondary | ICD-10-CM | POA: Insufficient documentation

## 2013-06-18 DIAGNOSIS — L03119 Cellulitis of unspecified part of limb: Secondary | ICD-10-CM | POA: Insufficient documentation

## 2013-06-18 DIAGNOSIS — Z87448 Personal history of other diseases of urinary system: Secondary | ICD-10-CM | POA: Insufficient documentation

## 2013-06-18 LAB — COMPREHENSIVE METABOLIC PANEL
Albumin: 3.2 g/dL — ABNORMAL LOW (ref 3.5–5.2)
Alkaline Phosphatase: 94 U/L (ref 39–117)
BUN: 19 mg/dL (ref 6–23)
Creatinine, Ser: 0.88 mg/dL (ref 0.50–1.35)
GFR calc Af Amer: >90 mL/min (ref >90)
Glucose, Bld: 124 mg/dL — ABNORMAL HIGH (ref 70–99)
Potassium: 4 mEq/L (ref 3.5–5.1)
Total Protein: 7.2 g/dL (ref 6.0–8.3)

## 2013-06-18 LAB — URINALYSIS, ROUTINE W REFLEX MICROSCOPIC
Ketones, ur: NEGATIVE mg/dL
Leukocytes, UA: NEGATIVE
Nitrite: NEGATIVE
Protein, ur: NEGATIVE mg/dL
pH: 5.5 (ref 5.0–8.0)

## 2013-06-18 LAB — CBC WITH DIFFERENTIAL/PLATELET
Basophils Relative: 0 % (ref 0–1)
Eosinophils Absolute: 0.3 10*3/uL (ref 0.0–0.7)
Eosinophils Relative: 3 % (ref 0–5)
HCT: 37.3 % — ABNORMAL LOW (ref 39.0–52.0)
Hemoglobin: 13.3 g/dL (ref 13.0–17.0)
Lymphs Abs: 2.3 10*3/uL (ref 0.7–4.0)
MCH: 26.5 pg (ref 26.0–34.0)
MCHC: 35.7 g/dL (ref 30.0–36.0)
MCV: 74.5 fL — ABNORMAL LOW (ref 78.0–100.0)
Monocytes Absolute: 1.4 10*3/uL — ABNORMAL HIGH (ref 0.1–1.0)
Monocytes Relative: 17 % — ABNORMAL HIGH (ref 3–12)
RBC: 5.01 MIL/uL (ref 4.22–5.81)

## 2013-06-18 LAB — OCCULT BLOOD, POC DEVICE: Fecal Occult Bld: POSITIVE — AB

## 2013-06-18 MED ORDER — HYDROCODONE-ACETAMINOPHEN 5-325 MG PO TABS: 1.0000 | ORAL_TABLET | ORAL | Status: DC | PRN

## 2013-06-18 NOTE — ED Provider Notes (Signed)
History    CSN: [id number] Arrival date & time 06/18/13  2956  First MD Initiated Contact with Patient 06/18/13 0351     Chief Complaint  Patient presents with  . Rectal Bleeding   (Consider location/radiation/quality/duration/timing/severity/associated sxs/prior Treatment) HPI History provided by pt.   Pt presents w/ multiple complaints.  Has had rectal bleeding since yesterday morning.  Notices only when having a BM.  No associated abd or rectal pain.  Has had constipation recently.  Denies lightheadedness/SOB/generalized weakness.  Also c/o abscess to L thigh.  Painful and non-draining despite warm compresses.  No associated fever.  Also c/o non-traumatic L low back pain x several days.  No cough or urinary sx.  Has had tingling LLE.  Past Medical History  Diagnosis Date  . Hypercholesteremia   . Hypertension   . Prostatitis   . Chronic back pain   . Hx of chlamydia infection   . Incidental lung nodule, > 3mm and < 8mm    Past Surgical History  Procedure Laterality Date  . Testicle removal    . Bunionectomy     History reviewed. No pertinent family history. History  Substance Use Topics  . Smoking status: Current Every Day Smoker    Types: Cigarettes  . Smokeless tobacco: Never Used  . Alcohol Use: 1.2 oz/week    2 Cans of beer per week    Review of Systems  All other systems reviewed and are negative.    Allergies  Review of patient's allergies indicates no known allergies.  Home Medications   Current Outpatient Rx  Name  Route  Sig  Dispense  Refill  . Tamsulosin HCl (FLOMAX) 0.4 MG CAPS   Oral   Take 1 capsule (0.4 mg total) by mouth daily.   30 capsule   1   . HYDROcodone-acetaminophen (NORCO/VICODIN) 5-325 MG per tablet   Oral   Take 1 tablet by mouth every 4 (four) hours as needed for pain.   20 tablet   0    BP 120/61  Pulse 58  Temp(Src) 98.6 F (37 C) (Oral)  Resp 18  SpO2 97% Physical Exam  Nursing note and vitals  reviewed. Constitutional: He is oriented to person, place, and time. He appears well-developed and well-nourished.  HENT:  Head: Normocephalic and atraumatic.  Eyes:  Normal appearance  Neck: Normal range of motion.  Cardiovascular: Normal rate and regular rhythm.   Pulmonary/Chest: Effort normal and breath sounds normal.  Abdominal: Soft. Bowel sounds are normal. He exhibits no distension. There is no tenderness.  Genitourinary:  No CVA ttp.  No external hemorrhoids or other perianal skin changes. No stool but a small amt pink mucous in rectum.  Non-tender.   Musculoskeletal:  Lumbar non-tender.  Mild tenderness L upper buttock. Full active ROM of LE and 5/5 strenght.  Nml patellar reflexes.  No saddle anesthesia. Distal sensation intact.  2+ DP pulses.  Ambulates w/out diffulty.   Neurological: He is alert and oriented to person, place, and time.  Skin: Skin is warm and dry. No rash noted.  2cm fluctuant, tender abscess L medial thigh.  Non-draining.  No overlying skin changes.   Psychiatric: He has a normal mood and affect. His behavior is normal.    ED Course  Procedures (including critical care time) INCISION AND DRAINAGE Performed by: Ruby Cola E Consent: Verbal consent obtained. Risks and benefits: risks, benefits and alternatives were discussed Type: abscess  Body area: L medial thigh  Anesthesia: local infiltration  Incision was made with a scalpel.  Local anesthetic: lidocaine 2% w/ epinephrine  Anesthetic total: 5 ml  Complexity: complex Blunt dissection to break up loculations  Drainage: purulent  Drainage amount: small  Packing material: none  Patient tolerance: Patient tolerated the procedure well with no immediate complications.    Labs Reviewed  CBC WITH DIFFERENTIAL - Abnormal; Notable for the following:    HCT 37.3 (*)    MCV 74.5 (*)    Monocytes Relative 17 (*)    Monocytes Absolute 1.4 (*)    All other components within normal  limits  COMPREHENSIVE METABOLIC PANEL - Abnormal; Notable for the following:    Glucose, Bld 124 (*)    Albumin 3.2 (*)    All other components within normal limits  OCCULT BLOOD, POC DEVICE - Abnormal; Notable for the following:    Fecal Occult Bld POSITIVE (*)    All other components within normal limits  URINALYSIS, ROUTINE W REFLEX MICROSCOPIC   No results found. 1. Rectal bleeding   2. Abscess   3. Low back pain     MDM  52yo M presents w/ multiple compaints.  Rectal bleeding since this morning.  Hgb w/in nml range, hemodynamically stable no s/sx of acute anemia.  Referred to GI and discussed importance of f/u.  Abscess of L medial thigh.  I&D'd.  Non-traumatic Left low back pain.  Suspect radiculopathy.  Afebrile, NV LEs intact and pt ambulatory.  Prescribed vicodin for abscess/back pain and recommended NSAID, avoidance of aggravating activities and heat/ice as well.  Return precautions discussed.   Otilio Miu, PA-C 06/18/13 2328  Otilio Miu, PA-C 06/18/13 [phone]

## 2013-06-18 NOTE — ED Notes (Addendum)
Pt states noticed blood in stool earlier today and a boil to upper Left inner thigh. Also, voice complaint of back pain left lower side.

## 2013-06-19 NOTE — ED Provider Notes (Signed)
Medical screening examination/treatment/procedure(s) were performed by non-physician practitioner and as supervising physician I was immediately available for consultation/collaboration.  Sunnie Nielsen, MD 06/19/13 [phone]

## 2013-08-26 ENCOUNTER — Emergency Department (HOSPITAL_COMMUNITY)
Admission: EM | Admit: 2013-08-26 | Discharge: 2013-08-26 | Discharge status: Against Medical Advice | Payer: PRIVATE HEALTH INSURANCE | Attending: Emergency Medicine | Admitting: Emergency Medicine

## 2013-08-26 ENCOUNTER — Encounter (HOSPITAL_COMMUNITY): Payer: Self-pay | Admitting: *Deleted

## 2013-08-26 ENCOUNTER — Encounter (HOSPITAL_COMMUNITY): Payer: Self-pay

## 2013-08-26 ENCOUNTER — Emergency Department (HOSPITAL_COMMUNITY): Payer: PRIVATE HEALTH INSURANCE

## 2013-08-26 DIAGNOSIS — Z8639 Personal history of other endocrine, nutritional and metabolic disease: Secondary | ICD-10-CM | POA: Insufficient documentation

## 2013-08-26 DIAGNOSIS — M7989 Other specified soft tissue disorders: Secondary | ICD-10-CM | POA: Insufficient documentation

## 2013-08-26 DIAGNOSIS — M545 Low back pain, unspecified: Secondary | ICD-10-CM | POA: Insufficient documentation

## 2013-08-26 DIAGNOSIS — F17200 Nicotine dependence, unspecified, uncomplicated: Secondary | ICD-10-CM | POA: Insufficient documentation

## 2013-08-26 DIAGNOSIS — M25559 Pain in unspecified hip: Secondary | ICD-10-CM | POA: Insufficient documentation

## 2013-08-26 DIAGNOSIS — Z862 Personal history of diseases of the blood and blood-forming organs and certain disorders involving the immune mechanism: Secondary | ICD-10-CM | POA: Insufficient documentation

## 2013-08-26 DIAGNOSIS — Z87448 Personal history of other diseases of urinary system: Secondary | ICD-10-CM | POA: Insufficient documentation

## 2013-08-26 DIAGNOSIS — Z79899 Other long term (current) drug therapy: Secondary | ICD-10-CM | POA: Insufficient documentation

## 2013-08-26 DIAGNOSIS — Z8619 Personal history of other infectious and parasitic diseases: Secondary | ICD-10-CM | POA: Insufficient documentation

## 2013-08-26 DIAGNOSIS — G8929 Other chronic pain: Secondary | ICD-10-CM | POA: Insufficient documentation

## 2013-08-26 DIAGNOSIS — I10 Essential (primary) hypertension: Secondary | ICD-10-CM | POA: Insufficient documentation

## 2013-08-26 DIAGNOSIS — M79609 Pain in unspecified limb: Secondary | ICD-10-CM | POA: Insufficient documentation

## 2013-08-26 DIAGNOSIS — M79644 Pain in right finger(s): Secondary | ICD-10-CM

## 2013-08-26 LAB — CBC WITH DIFFERENTIAL/PLATELET
Basophils Absolute: 0 10*3/uL (ref 0.0–0.1)
Basophils Relative: 0 % (ref 0–1)
Eosinophils Absolute: 0.2 10*3/uL (ref 0.0–0.7)
Eosinophils Relative: 3 % (ref 0–5)
HCT: 37 % — ABNORMAL LOW (ref 39.0–52.0)
Hemoglobin: 13.4 g/dL (ref 13.0–17.0)
Lymphocytes Relative: 39 % (ref 12–46)
Lymphs Abs: 2.6 10*3/uL (ref 0.7–4.0)
MCH: 26.8 pg (ref 26.0–34.0)
MCHC: 36.2 g/dL — ABNORMAL HIGH (ref 30.0–36.0)
MCV: 74 fL — ABNORMAL LOW (ref 78.0–100.0)
Monocytes Absolute: 0.7 10*3/uL (ref 0.1–1.0)
Monocytes Relative: 11 % (ref 3–12)
Neutro Abs: 3.2 10*3/uL (ref 1.7–7.7)
Neutrophils Relative %: 47 % (ref 43–77)
Platelets: 236 10*3/uL (ref 150–400)
RBC: 5 MIL/uL (ref 4.22–5.81)
RDW: 14.9 % (ref 11.5–15.5)
WBC: 6.7 10*3/uL (ref 4.0–10.5)

## 2013-08-26 LAB — BASIC METABOLIC PANEL WITH GFR
BUN: 15 mg/dL (ref 6–23)
CO2: 25 meq/L (ref 19–32)
Calcium: 9 mg/dL (ref 8.4–10.5)
Chloride: 101 meq/L (ref 96–112)
Creatinine, Ser: 0.89 mg/dL (ref 0.50–1.35)
GFR calc Af Amer: >90 mL/min
GFR calc non Af Amer: >90 mL/min
Glucose, Bld: 93 mg/dL (ref 70–99)
Potassium: 3.9 meq/L (ref 3.5–5.1)
Sodium: 136 meq/L (ref 135–145)

## 2013-08-26 LAB — SEDIMENTATION RATE: Sed Rate: 21 mm/hr — ABNORMAL HIGH (ref 0–16)

## 2013-08-26 LAB — URIC ACID: Uric Acid, Serum: 7.6 mg/dL (ref 4.0–7.8)

## 2013-08-26 LAB — C-REACTIVE PROTEIN: CRP: <0.5 mg/dL — ABNORMAL LOW

## 2013-08-26 MED ORDER — HYDROCODONE-ACETAMINOPHEN 5-325 MG PO TABS
2.0000 | ORAL_TABLET | Freq: Once | ORAL | Status: AC
  Administered 2013-08-26: 2 via ORAL
  Filled 2013-08-26: qty 2

## 2013-08-26 NOTE — ED Notes (Signed)
Patient transported to X-ray 

## 2013-08-26 NOTE — ED Notes (Signed)
Pt complains of back and hip pain each night when going to bed and difficult to get up and also has middle finger pain on right hand, sts swelling and throbbing.

## 2013-08-26 NOTE — ED Notes (Signed)
Pt c/o pain left hip; pain in right hand middle finger/swelling

## 2013-08-26 NOTE — ED Notes (Addendum)
Pt informed several times by several RN's and MD's that if he were to leave again he would have to start the process of being seen over again.  Pt ambulatory out of doors, through ED lobby and out of front ED doors- AMA per Dr. Loretha Stapler.

## 2013-08-26 NOTE — ED Provider Notes (Signed)
Medical screening examination/treatment/procedure(s) were performed by non-physician practitioner and as supervising physician I was immediately available for consultation/collaboration.   Valorie Mcgrory David Zamiyah Resendes, MD 08/26/13 1751 

## 2013-08-26 NOTE — ED Notes (Signed)
Patient left to go outside again.   I spoke with Lequita Halt, charge RN, due to there being others in the waiting room, patient advised that if he goes outside again that he will have to start the process over again.  He was advised that if he is not in the room when hand surgery comes, they will not wait for him.   Patient verbalized understanding.

## 2013-08-26 NOTE — ED Provider Notes (Signed)
CSN: [id number]     Arrival date & time 08/26/13  0503 History   First MD Initiated Contact with Patient 08/26/13 0600     Chief Complaint  Patient presents with  . Hand Injury  . Hip Pain  . Back Pain   (Consider location/radiation/quality/duration/timing/severity/associated sxs/prior Treatment) HPI Comments: 52 year old male with a history of prostatitis, prostatic hypertrophy, hypertension and chronic back/left hip pain presents with acute onset of non-traumatic right middle finger pain and swelling around 10pm last night as well as lower back and left hip pain x several days to weeks. Finger pain worse with any movement and palpation and not improved with rest. Back/hip pain worse when lying on the left side and with movement, particularly getting out of the bed and bending over. Works as a Copy on the night shift. Denies recent injury, numbness, tingling, weakness, urinary symptoms.  Patient is a 52 y.o. male presenting with hand injury, hip pain, and back pain. The history is provided by the patient.  Hand Injury Associated symptoms: back pain   Associated symptoms: no fever and no neck pain   Hip Pain Pertinent negatives include no abdominal pain, chest pain, coughing, fever, neck pain, numbness, sore throat or weakness.  Back Pain Associated symptoms: no abdominal pain, no chest pain, no fever, no numbness and no weakness     Past Medical History  Diagnosis Date  . Hypercholesteremia   . Hypertension   . Prostatitis   . Chronic back pain   . Hx of chlamydia infection   . Incidental lung nodule, > 3mm and < 8mm    Past Surgical History  Procedure Laterality Date  . Testicle removal    . Bunionectomy     History reviewed. No pertinent family history. History  Substance Use Topics  . Smoking status: Current Every Day Smoker    Types: Cigarettes  . Smokeless tobacco: Never Used  . Alcohol Use: 1.2 oz/week    2 Cans of beer per week    Review of Systems    Constitutional: Negative for fever.  HENT: Negative for sore throat, rhinorrhea and neck pain.   Eyes: Negative for visual disturbance.  Respiratory: Negative for cough.   Cardiovascular: Negative for chest pain.  Gastrointestinal: Negative for abdominal pain.  Musculoskeletal: Positive for back pain.  Skin: Negative for color change.  Neurological: Negative for weakness and numbness.  Psychiatric/Behavioral: Negative for agitation.    Allergies  Review of patient's allergies indicates no known allergies.  Home Medications   Current Outpatient Rx  Name  Route  Sig  Dispense  Refill  . Tamsulosin HCl (FLOMAX) 0.4 MG CAPS   Oral   Take 1 capsule (0.4 mg total) by mouth daily.   30 capsule   1    BP 142/94  Pulse 55  Temp(Src) 97.7 F (36.5 C) (Oral)  Resp 18  Ht 5\' 7"  (1.702 m)  Wt 240 lb (108.863 kg)  BMI 37.58 kg/m2  SpO2 98% Physical Exam  Nursing note and vitals reviewed. Constitutional: He is oriented to person, place, and time. He appears well-developed and well-nourished.  HENT:  Head: Normocephalic and atraumatic.  Eyes: EOM are normal.  Neck: Normal range of motion.  Cardiovascular: Normal rate, regular rhythm, normal heart sounds and intact distal pulses.   Pulmonary/Chest: Effort normal and breath sounds normal.  Abdominal: Soft. Bowel sounds are normal. There is no tenderness.  Musculoskeletal:       Lumbar back: He exhibits decreased range of motion, tenderness,  pain and spasm. He exhibits no bony tenderness, no swelling, no edema and normal pulse.       Back:       Hands: Right middle finger with swelling diffusely and tenderness to the dorsal and volar surface of the prox phalanx and PIP joint. Marked tenderness with passive finger extension and flexion. No erythema, no warmth, no fluctuance.  Neurological: He is alert and oriented to person, place, and time. He has normal strength and normal reflexes. No sensory deficit.  Skin: Skin is warm and dry.   Psychiatric: He has a normal mood and affect.    ED Course  Procedures (including critical care time) Labs Review Labs Reviewed  CBC WITH DIFFERENTIAL - Abnormal; Notable for the following:    HCT 37.0 (*)    MCV 74.0 (*)    MCHC 36.2 (*)    All other components within normal limits  SEDIMENTATION RATE - Abnormal; Notable for the following:    Sed Rate 21 (*)    All other components within normal limits  URIC ACID  BASIC METABOLIC PANEL  C-REACTIVE PROTEIN   Imaging Review Dg Finger Middle Right  08/26/2013   *RADIOLOGY REPORT*  Clinical Data: Status post hand injury.  PIP joint middle finger.  RIGHT MIDDLE FINGER 2+V  Comparison: None  Findings: Normal anatomic alignment.  No evidence for acute fracture or dislocation.  Regional soft tissues are unremarkable.  IMPRESSION: No evidence for acute fracture or dislocation.   Original Report Authenticated By: Annia Belt, M.D    MDM   1. Finger pain, right    Patient with dual complaint of left hip/lower back pain and right middle digit pain. Neuro exam reveals no deficits. Back/hip pain is similar to chronic, no bony TTP, no spine/hip imaging indicated. Finger mildly swollen with marked tenderness with both passive flexion and extension. No evidence of cellulitis nor abscess. Afebrile and nontoxic with no hx of trauma. Xray neg for acute bony pathology. Concerned for tenosynovitis, inflammatory vs infectious. Discussed with hand specialist and will obtain labs and consult.  Patient left prior to consultant evaluation without notifying the staff. No discharge papers were prepared nor given to the patient as he eloped. Consultant notified of patient's elopement.       Simmie Davies, NP 08/26/13 [phone]

## 2013-08-26 NOTE — ED Notes (Signed)
PT left and did not want to be seen.

## 2013-08-26 NOTE — ED Notes (Signed)
Previous RN advised that patient leaves room and keeps going outside.   Attending MD went to see patient and he was outside smoking.   I went to get patient and advised that he cannot keep leaving room.   Patient stated "I had an important phone call to make, so I went to my car and got a cigarette".   Patient will not sit in bed.  Patient does not want to be hooked up to monitor.   Patient refused to wear a gown.    Patient did come back to room and was advised by Loretha Stapler, MD and RN that he needed to stay in room.   Patient verbalized understanding.

## 2013-08-26 NOTE — ED Notes (Signed)
Patient came out of room and advised "I'm hungry".  Patient told he is NPO at this time.  Patient got upset and again advised "I said I'm hungry".  Patient again told why he can't eat at this time.

## 2013-08-26 NOTE — ED Provider Notes (Signed)
Medical screening examination/treatment/procedure(s) were conducted as a shared visit with non-physician practitioner(s) and myself.  I personally evaluated the patient during the encounter  52 year old male with swelling to his right middle finger. Digit is swollen, however no erythema or warmth. Good distal perfusion. limited flexion and extension. Patient not systemically ill or toxic appearing. Initial blood work unremarkable. Plan was for hand surgical consultation, however, patient has eloped from the emergency department.  Clinical Impression: 1. Finger pain, right       Candyce Churn, MD 08/26/13 [phone]

## 2013-08-26 NOTE — ED Notes (Signed)
Patient awake.  Patient upset that he has to wait for hand surgery.  Patient states "I don't want to wait on them.  They can call me and let me know when I get home".  I asked patient if he was leaving.  He advised "I have to go check on my son.  Y'all are getting on my nerves.  This is a messed up place that makes you wait".  Patient is currently still in his room.

## 2013-10-04 ENCOUNTER — Emergency Department (HOSPITAL_COMMUNITY): Payer: PRIVATE HEALTH INSURANCE

## 2013-10-04 ENCOUNTER — Emergency Department (HOSPITAL_COMMUNITY)
Admission: EM | Admit: 2013-10-04 | Discharge: 2013-10-04 | Disposition: A | Discharge status: Home / Self Care | Payer: PRIVATE HEALTH INSURANCE | Attending: Emergency Medicine | Admitting: Emergency Medicine

## 2013-10-04 ENCOUNTER — Encounter (HOSPITAL_COMMUNITY): Payer: Self-pay | Admitting: Emergency Medicine

## 2013-10-04 DIAGNOSIS — Z8619 Personal history of other infectious and parasitic diseases: Secondary | ICD-10-CM | POA: Insufficient documentation

## 2013-10-04 DIAGNOSIS — Z87448 Personal history of other diseases of urinary system: Secondary | ICD-10-CM | POA: Insufficient documentation

## 2013-10-04 DIAGNOSIS — Y9389 Activity, other specified: Secondary | ICD-10-CM | POA: Insufficient documentation

## 2013-10-04 DIAGNOSIS — Y92410 Unspecified street and highway as the place of occurrence of the external cause: Secondary | ICD-10-CM | POA: Insufficient documentation

## 2013-10-04 DIAGNOSIS — F17200 Nicotine dependence, unspecified, uncomplicated: Secondary | ICD-10-CM | POA: Insufficient documentation

## 2013-10-04 DIAGNOSIS — IMO0002 Reserved for concepts with insufficient information to code with codable children: Secondary | ICD-10-CM | POA: Insufficient documentation

## 2013-10-04 DIAGNOSIS — Z862 Personal history of diseases of the blood and blood-forming organs and certain disorders involving the immune mechanism: Secondary | ICD-10-CM | POA: Insufficient documentation

## 2013-10-04 DIAGNOSIS — T148XXA Other injury of unspecified body region, initial encounter: Secondary | ICD-10-CM | POA: Insufficient documentation

## 2013-10-04 DIAGNOSIS — Z8639 Personal history of other endocrine, nutritional and metabolic disease: Secondary | ICD-10-CM | POA: Insufficient documentation

## 2013-10-04 DIAGNOSIS — S6990XA Unspecified injury of unspecified wrist, hand and finger(s), initial encounter: Secondary | ICD-10-CM | POA: Insufficient documentation

## 2013-10-04 DIAGNOSIS — S59909A Unspecified injury of unspecified elbow, initial encounter: Secondary | ICD-10-CM | POA: Insufficient documentation

## 2013-10-04 DIAGNOSIS — I10 Essential (primary) hypertension: Secondary | ICD-10-CM | POA: Insufficient documentation

## 2013-10-04 DIAGNOSIS — J351 Hypertrophy of tonsils: Secondary | ICD-10-CM | POA: Insufficient documentation

## 2013-10-04 DIAGNOSIS — Z79899 Other long term (current) drug therapy: Secondary | ICD-10-CM | POA: Insufficient documentation

## 2013-10-04 MED ORDER — CYCLOBENZAPRINE HCL 10 MG PO TABS: 10.0000 mg | ORAL_TABLET | Freq: Three times a day (TID) | ORAL | Status: DC | PRN

## 2013-10-04 MED ORDER — HYDROCODONE-ACETAMINOPHEN 5-325 MG PO TABS
1.0000 | ORAL_TABLET | Freq: Once | ORAL | Status: AC
  Administered 2013-10-04: 1 via ORAL
  Filled 2013-10-04: qty 1

## 2013-10-04 MED ORDER — NAPROXEN 500 MG PO TABS: 500.0000 mg | ORAL_TABLET | Freq: Two times a day (BID) | ORAL | Status: DC

## 2013-10-04 NOTE — ED Provider Notes (Signed)
Medical screening examination/treatment/procedure(s) were performed by non-physician practitioner and as supervising physician I was immediately available for consultation/collaboration.  EKG Interpretation   None        Jazzelle Zhang K Maveryck Bahri-Rasch, MD 10/04/13 0559 

## 2013-10-04 NOTE — ED Notes (Signed)
Wife states understanding of discharge instructions

## 2013-10-04 NOTE — ED Notes (Signed)
Pt kept falling asleep during examination.
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
## 2013-10-04 NOTE — ED Notes (Signed)
XRAY called and informed pt transferred to A12.
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
## 2014-06-12 ENCOUNTER — Emergency Department (HOSPITAL_COMMUNITY)
Admission: EM | Admit: 2014-06-12 | Discharge: 2014-06-12 | Disposition: A | Discharge status: Home / Self Care | Payer: BC Managed Care – PPO | Attending: Emergency Medicine | Admitting: Emergency Medicine

## 2014-06-12 ENCOUNTER — Encounter (HOSPITAL_COMMUNITY): Payer: Self-pay | Admitting: Emergency Medicine

## 2014-06-12 DIAGNOSIS — I10 Essential (primary) hypertension: Secondary | ICD-10-CM | POA: Insufficient documentation

## 2014-06-12 DIAGNOSIS — K089 Disorder of teeth and supporting structures, unspecified: Secondary | ICD-10-CM | POA: Insufficient documentation

## 2014-06-12 DIAGNOSIS — Z79899 Other long term (current) drug therapy: Secondary | ICD-10-CM | POA: Insufficient documentation

## 2014-06-12 DIAGNOSIS — Z791 Long term (current) use of non-steroidal anti-inflammatories (NSAID): Secondary | ICD-10-CM | POA: Insufficient documentation

## 2014-06-12 DIAGNOSIS — Z8639 Personal history of other endocrine, nutritional and metabolic disease: Secondary | ICD-10-CM | POA: Insufficient documentation

## 2014-06-12 DIAGNOSIS — Z87448 Personal history of other diseases of urinary system: Secondary | ICD-10-CM | POA: Insufficient documentation

## 2014-06-12 DIAGNOSIS — Z8619 Personal history of other infectious and parasitic diseases: Secondary | ICD-10-CM | POA: Insufficient documentation

## 2014-06-12 DIAGNOSIS — G8929 Other chronic pain: Secondary | ICD-10-CM | POA: Insufficient documentation

## 2014-06-12 DIAGNOSIS — Z862 Personal history of diseases of the blood and blood-forming organs and certain disorders involving the immune mechanism: Secondary | ICD-10-CM | POA: Insufficient documentation

## 2014-06-12 DIAGNOSIS — R040 Epistaxis: Secondary | ICD-10-CM

## 2014-06-12 DIAGNOSIS — F17200 Nicotine dependence, unspecified, uncomplicated: Secondary | ICD-10-CM | POA: Insufficient documentation

## 2014-06-12 MED ORDER — PENICILLIN V POTASSIUM 250 MG PO TABS: 250.0000 mg | ORAL_TABLET | Freq: Four times a day (QID) | ORAL | Status: AC

## 2014-06-12 MED ORDER — SALINE SPRAY 0.65 % NA SOLN: 1.0000 | NASAL | Status: DC | PRN

## 2014-06-12 MED ORDER — HYDROCODONE-ACETAMINOPHEN 5-325 MG PO TABS
1.0000 | ORAL_TABLET | Freq: Four times a day (QID) | ORAL | Status: DC | PRN
Start: 2014-06-12 — End: 2015-06-06

## 2014-06-12 MED ORDER — SILVER NITRATE-POT NITRATE 75-25 % EX MISC
1.0000 | Freq: Once | CUTANEOUS | Status: DC
  Filled 2014-06-12: qty 1

## 2014-06-12 NOTE — ED Notes (Signed)
Declined W/C at D/C and was escorted to lobby by RN. 

## 2014-06-12 NOTE — ED Provider Notes (Signed)
CSN: [id number]     Arrival date & time 06/12/14  1652 History  This chart was scribed for non-physician practitioner, Roxy Horsemanobert Giavonni Cizek, PA-C working with Linwood DibblesJon Knapp, MD by Luisa DagoPriscilla Tutu, ED scribe. This patient was seen in room TR08C/TR08C and the patient's care was started at 6:26 PM.    Chief Complaint  Patient presents with  . Epistaxis   The history is provided by the patient. No language interpreter was used.   HPI Comments: Douglas Glass is a 53 y.o. male who presents to the Emergency Department with a chief complaint of intermittent episodes of epistaxis that started approximately 3 days ago. Pt states that he does not know the cause of it. He states that his last nose bleed was 2 hours ago. He denies any recent injuries, head trauma, abdominal pain, fever, chills, nausea, emesis, or diaphoresis.   Secondarily, pt is complaining of dental pain. He states that he does not have a dentist and would like to get it treated.  Past Medical History  Diagnosis Date  . Hypercholesteremia   . Hypertension   . Prostatitis   . Chronic back pain   . Hx of chlamydia infection   . Incidental lung nodule, > 3mm and < 8mm    Past Surgical History  Procedure Laterality Date  . Testicle removal    . Bunionectomy     History reviewed. No pertinent family history. History  Substance Use Topics  . Smoking status: Current Every Day Smoker    Types: Cigarettes  . Smokeless tobacco: Never Used  . Alcohol Use: 1.2 oz/week    2 Cans of beer per week    Review of Systems  Constitutional: Negative for fever and chills.  HENT: Positive for nosebleeds. Negative for congestion.   Respiratory: Negative for cough and shortness of breath.   Cardiovascular: Negative for chest pain.  Gastrointestinal: Negative for nausea, vomiting and abdominal pain.  Skin: Negative for rash.      Allergies  Review of patient's allergies indicates no known allergies.  Home Medications   Prior to Admission  medications   Medication Sig Start Date End Date Taking? Authorizing Provider  cyclobenzaprine (FLEXERIL) 10 MG tablet Take 1 tablet (10 mg total) by mouth 3 (three) times daily as needed for muscle spasms. 10/04/13   Phill MutterPeter S Dammen, PA-C  naproxen (NAPROSYN) 500 MG tablet Take 1 tablet (500 mg total) by mouth 2 (two) times daily. 10/04/13   Angus SellerPeter S Dammen, PA-C  Tamsulosin HCl (FLOMAX) 0.4 MG CAPS Take 1 capsule (0.4 mg total) by mouth daily. 08/06/12   Vida RollerBrian D Miller, MD   BP 131/72  Pulse 70  Temp(Src) 98.3 F (36.8 C) (Oral)  Resp 16  Ht 5\' 7"  (1.702 m)  Wt 240 lb 11.2 oz (109.181 kg)  BMI 37.69 kg/m2  SpO2 94%  Physical Exam  Nursing note and vitals reviewed. Constitutional: He is oriented to person, place, and time. He appears well-developed and well-nourished. No distress.  HENT:  Head: Normocephalic and atraumatic.  Mouth/Throat:    Right nostril remarkable for rough irritated skin along the medial aspect of the nasal septum. No active bleeding. Bilateral nares are patent.   Poor dentition throughout.  Affected tooth as diagrammed.  No signs of peritonsillar or tonsillar abscess.  No signs of gingival abscess. Oropharynx is clear and without exudates.  Uvula is midline.  Airway is intact. No signs of Ludwig's angina with palpation of oral and sublingual mucosa.   Eyes: Conjunctivae and EOM  are normal.  Neck: Neck supple.  Cardiovascular: Normal rate.   Pulmonary/Chest: Effort normal. No respiratory distress.  Musculoskeletal: Normal range of motion.  Neurological: He is alert and oriented to person, place, and time.  Skin: Skin is warm and dry.  Psychiatric: He has a normal mood and affect. His behavior is normal.    ED Course  Procedures (including critical care time)  DIAGNOSTIC STUDIES: Oxygen Saturation is 94% on RA, low by my interpretation.    COORDINATION OF CARE: 6:28 PM- Pt advised of plan for treatment and pt agrees. Advised pt to keep the irritated area  moist. Will prescribe pain medication for the dental pain.   Labs Review Labs Reviewed - No data to display  Imaging Review No results found.   EKG Interpretation None      MDM   Final diagnoses:  None    Patient on toothache. No active bleeding. I was able to see the irritated area on the nasal septum, silver nitrate was applied with no recurrence of bleeding. Discharge to home with instructions to use nasal saline sprays. Also encouraged patient to followup with his dentist.  I personally performed the services described in this documentation, which was scribed in my presence. The recorded information has been reviewed and is accurate.    Roxy Horsemanobert Kinnley Paulson, PA-C 06/12/14 1839

## 2014-06-12 NOTE — ED Provider Notes (Signed)
Medical screening examination/treatment/procedure(s) were performed by non-physician practitioner and as supervising physician I was immediately available for consultation/collaboration.   Makiah Foye, MD 06/12/14 1929 

## 2014-06-12 NOTE — Discharge Instructions (Signed)
Nosebleed Nosebleeds can be caused by many conditions including trauma, infections, polyps, foreign bodies, dry mucous membranes or climate, medications and air conditioning. Most nosebleeds occur in the front of the nose. It is because of this location that most nosebleeds can be controlled by pinching the nostrils gently and continuously. Do this for at least 10 to 20 minutes. The reason for this long continuous pressure is that you must hold it long enough for the blood to clot. If during that 10 to 20 minute time period, pressure is released, the process may have to be started again. The nosebleed may stop by itself, quit with pressure, need concentrated heating (cautery) or stop with pressure from packing. HOME CARE INSTRUCTIONS   If your nose was packed, try to maintain the pack inside until your caregiver removes it. If a gauze pack was used and it starts to fall out, gently replace or cut the end off. Do not cut if a balloon catheter was used to pack the nose. Otherwise, do not remove unless instructed.  Avoid blowing your nose for 12 hours after treatment. This could dislodge the pack or clot and start bleeding again.  If the bleeding starts again, sit up and bending forward, gently pinch the front half of your nose continuously for 20 minutes.  If bleeding was caused by dry mucous membranes, cover the inside of your nose every morning with a petroleum or antibiotic ointment. Use your little fingertip as an applicator. Do this as needed during dry weather. This will keep the mucous membranes moist and allow them to heal.  Maintain humidity in your home by using less air conditioning or using a humidifier.  Do not use aspirin or medications which make bleeding more likely. Your caregiver can give you recommendations on this.  Resume normal activities as able but try to avoid straining, lifting or bending at the waist for several days.  If the nosebleeds become recurrent and the cause is  unknown, your caregiver may suggest laboratory tests. SEEK IMMEDIATE MEDICAL CARE IF:   Bleeding recurs and cannot be controlled.  There is unusual bleeding from or bruising on other parts of the body.  You have a fever.  Nosebleeds continue.  There is any worsening of the condition which originally brought you in.  You become lightheaded, feel faint, become sweaty or vomit blood. MAKE SURE YOU:   Understand these instructions.  Will watch your condition.  Will get help right away if you are not doing well or get worse. Document Released: 09/03/2005 Document Revised: 02/16/2012 Document Reviewed: 10/26/2009 [facility]Exit[facility] Patient Information 2015 [place]ExitCare, [place]. This information is not intended to replace advice given to you by your health care provider. Make sure you discuss any questions you have with your health care provider. Dental Pain A tooth ache may be caused by cavities (tooth decay). Cavities expose the nerve of the tooth to air and hot or cold temperatures. It may come from an infection or abscess (also called a boil or furuncle) around your tooth. It is also often caused by dental caries (tooth decay). This causes the pain you are having. DIAGNOSIS  Your caregiver can diagnose this problem by exam. TREATMENT   If caused by an infection, it may be treated with medications which kill germs (antibiotics) and pain medications as prescribed by your caregiver. Take medications as directed.  Only take over-the-counter or prescription medicines for pain, discomfort, or fever as directed by your caregiver.  Whether the tooth ache today is caused  by infection or dental disease, you should see your dentist as soon as possible for further care. SEEK MEDICAL CARE IF: The exam and treatment you received today has been provided on an emergency basis only. This is not a substitute for complete medical or dental care. If your problem worsens or new problems (symptoms) appear, and you are  unable to meet with your dentist, call or return to this location. SEEK IMMEDIATE MEDICAL CARE IF:   You have a fever.  You develop redness and swelling of your face, jaw, or neck.  You are unable to open your mouth.  You have severe pain uncontrolled by pain medicine. MAKE SURE YOU:   Understand these instructions.  Will watch your condition.  Will get help right away if you are not doing well or get worse. Document Released: 11/24/2005 Document Revised: 02/16/2012 Document Reviewed: 07/12/2008 [facility] CruzExitCare Patient Information 2015 [place]ExitCare, [place]LLC. This information is not intended to replace advice given to you by your health care provider. Make sure you discuss any questions you have with your health care provider.

## 2014-06-12 NOTE — ED Notes (Addendum)
He states this week hes had several nose bleeds. He denies any injuries. Denies past history of nose bleeds. No bleeding now. He also would like to have his toothache checked while hes here

## 2014-11-02 ENCOUNTER — Encounter (HOSPITAL_COMMUNITY): Payer: Self-pay | Admitting: *Deleted

## 2014-11-02 ENCOUNTER — Emergency Department (HOSPITAL_COMMUNITY)
Admission: EM | Admit: 2014-11-02 | Discharge: 2014-11-02 | Disposition: A | Discharge status: Home / Self Care | Payer: Self-pay | Attending: Emergency Medicine | Admitting: Emergency Medicine

## 2014-11-02 ENCOUNTER — Emergency Department (HOSPITAL_COMMUNITY): Payer: PRIVATE HEALTH INSURANCE

## 2014-11-02 DIAGNOSIS — Z720 Tobacco use: Secondary | ICD-10-CM | POA: Insufficient documentation

## 2014-11-02 DIAGNOSIS — Z79899 Other long term (current) drug therapy: Secondary | ICD-10-CM | POA: Insufficient documentation

## 2014-11-02 DIAGNOSIS — Z8619 Personal history of other infectious and parasitic diseases: Secondary | ICD-10-CM | POA: Insufficient documentation

## 2014-11-02 DIAGNOSIS — G8929 Other chronic pain: Secondary | ICD-10-CM | POA: Insufficient documentation

## 2014-11-02 DIAGNOSIS — I10 Essential (primary) hypertension: Secondary | ICD-10-CM | POA: Insufficient documentation

## 2014-11-02 DIAGNOSIS — Z791 Long term (current) use of non-steroidal anti-inflammatories (NSAID): Secondary | ICD-10-CM | POA: Insufficient documentation

## 2014-11-02 DIAGNOSIS — M5431 Sciatica, right side: Secondary | ICD-10-CM | POA: Insufficient documentation

## 2014-11-02 DIAGNOSIS — W19XXXA Unspecified fall, initial encounter: Secondary | ICD-10-CM

## 2014-11-02 DIAGNOSIS — Z8639 Personal history of other endocrine, nutritional and metabolic disease: Secondary | ICD-10-CM | POA: Insufficient documentation

## 2014-11-02 DIAGNOSIS — Z87438 Personal history of other diseases of male genital organs: Secondary | ICD-10-CM | POA: Insufficient documentation

## 2014-11-02 MED ORDER — PREDNISONE 20 MG PO TABS: 40.0000 mg | ORAL_TABLET | Freq: Every day | ORAL | Status: DC

## 2014-11-02 MED ORDER — DIAZEPAM 5 MG PO TABS: 5.0000 mg | ORAL_TABLET | Freq: Three times a day (TID) | ORAL | Status: DC | PRN

## 2014-11-02 MED ORDER — DIAZEPAM 5 MG/ML IJ SOLN
5.0000 mg | Freq: Once | INTRAMUSCULAR | Status: AC
  Administered 2014-11-02: 5 mg via INTRAMUSCULAR
  Filled 2014-11-02: qty 2

## 2014-11-02 MED ORDER — DIAZEPAM 5 MG/ML IJ SOLN: 5.0000 mg | Freq: Once | INTRAMUSCULAR | Status: DC

## 2014-11-02 MED ORDER — KETOROLAC TROMETHAMINE 60 MG/2ML IM SOLN
60.0000 mg | Freq: Once | INTRAMUSCULAR | Status: AC
  Administered 2014-11-02: 60 mg via INTRAMUSCULAR
  Filled 2014-11-02: qty 2

## 2014-11-02 NOTE — ED Notes (Signed)
Pt ambulated to restroom with steady gait.

## 2014-11-02 NOTE — ED Notes (Signed)
tge pot is c/o rt hip pain for 2 weeks and he has been falling.  He denies injury just that his leg gives way on him and he falls

## 2014-11-02 NOTE — Discharge Instructions (Signed)
Sciatica Sciatica is pain, weakness, numbness, or tingling along the path of the sciatic nerve. The nerve starts in the lower back and runs down the back of each leg. The nerve controls the muscles in the lower leg and in the back of the knee, while also providing sensation to the back of the thigh, lower leg, and the sole of your foot. Sciatica is a symptom of another medical condition. For instance, nerve damage or certain conditions, such as a herniated disk or bone spur on the spine, pinch or put pressure on the sciatic nerve. This causes the pain, weakness, or other sensations normally associated with sciatica. Generally, sciatica only affects one side of the body. CAUSES   Herniated or slipped disc.  Degenerative disk disease.  A pain disorder involving the narrow muscle in the buttocks (piriformis syndrome).  Pelvic injury or fracture.  Pregnancy.  Tumor (rare). SYMPTOMS  Symptoms can vary from mild to very severe. The symptoms usually travel from the low back to the buttocks and down the back of the leg. Symptoms can include:  Mild tingling or dull aches in the lower back, leg, or hip.  Numbness in the back of the calf or sole of the foot.  Burning sensations in the lower back, leg, or hip.  Sharp pains in the lower back, leg, or hip.  Leg weakness.  Severe back pain inhibiting movement. These symptoms may get worse with coughing, sneezing, laughing, or prolonged sitting or standing. Also, being overweight may worsen symptoms. DIAGNOSIS  Your caregiver will perform a physical exam to look for common symptoms of sciatica. He or she may ask you to do certain movements or activities that would trigger sciatic nerve pain. Other tests may be performed to find the cause of the sciatica. These may include:  Blood tests.  X-rays.  Imaging tests, such as an MRI or CT scan. TREATMENT  Treatment is directed at the cause of the sciatic pain. Sometimes, treatment is not necessary  and the pain and discomfort goes away on its own. If treatment is needed, your caregiver may suggest:  Over-the-counter medicines to relieve pain.  Prescription medicines, such as anti-inflammatory medicine, muscle relaxants, or narcotics.  Applying heat or ice to the painful area.  Steroid injections to lessen pain, irritation, and inflammation around the nerve.  Reducing activity during periods of pain.  Exercising and stretching to strengthen your abdomen and improve flexibility of your spine. Your caregiver may suggest losing weight if the extra weight makes the back pain worse.  Physical therapy.  Surgery to eliminate what is pressing or pinching the nerve, such as a bone spur or part of a herniated disk. HOME CARE INSTRUCTIONS   Only take over-the-counter or prescription medicines for pain or discomfort as directed by your caregiver.  Apply ice to the affected area for 20 minutes, 3-4 times a day for the first 48-72 hours. Then try heat in the same way.  Exercise, stretch, or perform your usual activities if these do not aggravate your pain.  Attend physical therapy sessions as directed by your caregiver.  Keep all follow-up appointments as directed by your caregiver.  Do not wear high heels or shoes that do not provide proper support.  Check your mattress to see if it is too soft. A firm mattress may lessen your pain and discomfort. SEEK IMMEDIATE MEDICAL CARE IF:   You lose control of your bowel or bladder (incontinence).  You have increasing weakness in the lower back, pelvis, buttocks,   or legs.  You have redness or swelling of your back.  You have a burning sensation when you urinate.  You have pain that gets worse when you lie down or awakens you at night.  Your pain is worse than you have experienced in the past.  Your pain is lasting longer than 4 weeks.  You are suddenly losing weight without reason. MAKE SURE YOU:  Understand these  instructions.  Will watch your condition.  Will get help right away if you are not doing well or get worse. Document Released: 11/18/2001 Document Revised: 05/25/2012 Document Reviewed: 04/04/2012 ExitCare Patient Information 2015 Exit[employer], LLC. This information is not intended to replace advice given to you by your health care provider. Make sure you discuss any questions you have with your health care provider.  

## 2014-11-02 NOTE — ED Provider Notes (Signed)
CSN: [id number]     Arrival date & time 11/02/14  1616 History   First MD Initiated Contact with Patient 11/02/14 1627     Chief Complaint  Patient presents with  . Hip Pain     (Consider location/radiation/quality/duration/timing/severity/associated sxs/prior Treatment) HPI Comments: Patient presents to the ED with a chief complaint of right hip pain.  Patient states that the pain has been intermittent for the past several months, but has been worse in the past 2 weeks.  He states that sometimes that pain is so bad that he falls to the ground.  The pain causes the falls.  He states that he has taken percocet with some relief.  He denies any fevers, chills, bowel or bladder incontinence.  He states that the pain radiates to the back of his leg.  The history is provided by the patient. No language interpreter was used.    Past Medical History  Diagnosis Date  . Hypercholesteremia   . Hypertension   . Prostatitis   . Chronic back pain   . Hx of chlamydia infectioLuelRiverside Hospital Of Louisianalen Pucke96578Georgia Bone And Join52tNew York City Children'S Center QueenCorneliousHUroloLuelBoston Children'Slen Pucke66578Lake [place] Communit49yDoctors Outpatient SuLuelMidwest Center For Day Surgerylen Pucke6578L[place] Memoria38lMagnolia Regional HeCorneliousProvidence Sacred Heart MediLuelWest Hills Hospital And Medical Centerlen Pucke26578Endoscopy Center O32fWillameLuelAleda E. Lutz Va Medical Centerlen Pucke6578Laredo Specialt77yNorthwoods SurgerLuelBaptist Medical Center Jacksonvillelen Pucke66578Shands Starke Regional Medi33cWhiteriver IndiCorneliousAOceans Behavioral Hospital Of The Permian B1LuelPalms West Hospitallen Pucke46578Gramercy Surgery 57COchsner Baptist MedCornelCameron RegioLuelRivendell Behavioral Health Serviceslen Pucke6578Telecare Willow R7oSt Vincent Charity MedCorneliousWiPeacehealth GastroenterLuelVa Eastern Colorado Healthcare Systemlen Pucke46578Hawthorn Children'S Psychiatri69cUniverity Of Md [place] Washington MedCornelioMedLuelAvenues Surgical Centerlen Pucke56578Bucyrus Communit9ySutter Center ForCorneliousSprMiLuelMusculoskeletal Ambulatory Surgery Centerlen Pucke76578Valley Physicians Surgery Center At Nort43hCity Pl SurCorneliousBrownsbLuelHennepin County Medical Ctrlen Pucke46578Arlington D50aKalispell Regional Medical Center Inc Dba Polson Health OutpatCorneliousNovant HLuelMillennium Healthcare Of Clifton LLClen Pucke86578Ellett Memoria81lTmcCorneliousDeBarnes-JeLuelBlackberry Centerlen Pucke86578Hca Houston Healthca76rNorthside HospCorneliousLuelSelect Specialty Hospital - Battle Creeklen Pucke76578Dignity Health Rehabilitatio12nHoag Memorial Hospital PCornelioLuelCentura Health-St Anthony Hospitallen Pucke56578Physicians Outpatient Surgery 60CWaCorneliousVillaBirmingham Ambulatory Surgical Center PKentuckyL76578Kaiser Fnd Hospital - Mor65eJefferson Surgical Ctr ACorneliousPhoenix ILuelCollege Medical Center South Campus D/P Aphlen Pucke76578Baptist [facility] - Carr29oSkin Cancer And Reconstructive SurgeryCorneliouEncompass HeaLuelSaint Francis Medical Centerlen Pucke56578Fairview Northlan14dSouth PerLuelSain Francis Hospital Vinitalen Pucke36578Dorminy Medi6cWisconsin SurgeryCorneliousMaSt VincLuelEfthemios Raphtis Md Pclen Pucke96578Kindred Hospit73aChildrens Hospital Of New JersCorneliousDProvo Canyon BLuelNorthwest Florida Surgery Centerlen Pucke76578Geisinger Wyoming Valley Medi5cAssencion Saint Vincent'S Medical CenteCornelioLuelHale County Hospitallen Pucke46578Mnh Gi Surgical 22CParkway SurgicLuelLoveland Endoscopy Center LLClen Pucke86578Northern Light A R Goul52d2020 SurgeryCorneliouSsm Health Rehabilitation Hosp68miUliceArkansas DeparLuelBig Horn County Memorial Hospitallen Pucke36578Desert Valle29yCleveland ClinCorneliousOrlaNLuelRiver Parishes Hospitallen Pucke76578Advanced Outpatient Surgery Of Ok33lMercer County Joint Township CommuniCorneliousMagnolLuelDoctors Hospitallen Pucke66578Meridian Plastic Surg16eGlastonbury SurCorneliousNorSummit Ambulatory Surgery CenKentuckyter0862PauAdministrator, Civil Servicemorial Hospitalte  . Testicle removal    . Bunionectomy     No family history on file. History  Substance Use Topics  . Smoking status: Current Every Day Smoker    Types: Cigarettes  . Smokeless tobacco: Never Used  . Alcohol Use: 1.2 oz/week    2 Cans of beer per week    Review of Systems  Constitutional: Negative for fever and chills.  Gastrointestinal:       No bowel incontinence  Genitourinary:       No urinary incontinence  Musculoskeletal: Positive for myalgias, back pain and arthralgias.  Neurological:       No saddle anesthesia  All other systems reviewed and are negative.     Allergies  Review of patient's allergies indicates no known allergies.  Home Medications   Prior to Admission medications   Medication Sig Start Date End Date Taking? Authorizing Provider  cyclobenzaprine (FLEXERIL) 10 MG tablet Take 1  tablet (10 mg total) by mouth 3 (three) times daily as needed for muscle spasms. 10/04/13   Peter S Dammen, PA-C  HYDROcodone-acetaminophen (NORCO/VICODIN) 5-325 MG per tablet Take 1-2 tablets by mouth every 6 (six) hours as needed. 06/12/14   Elita Dame, PA-C  naproxen (NAPROSYN) 500 MG tablet Take 1 tablet (500 mg total) by mouth 2 (two) times daily. 10/04/13   Peter S Dammen, PA-C  sodium chloride (OCEAN) 0.65 % SOLN nasal spray Place 1 spray into both nostrils as needed for congestion. 06/12/14   Carliyah Cotterman, PA-C  Tamsulosin HCl (FLOMAX) 0.4 MG CAPS Take 1 capsule (0.4 mg total) by mouth daily. 08/06/12   Brian D Miller, MD   BP 140/78 mmHg  Pulse 78  Temp(Src) 98.4 F (36.9 C) (Oral)  Resp 18  Wt 240 lb (108.863 kg)  SpO2 96% Physical Exam  Constitutional: He is oriented to person, place, and time. He appears well-developed and well-nourished. No distress.  HENT:  Head: Normocephalic and atraumatic.  Eyes: Conjunctivae and EOM are normal. Right eye exhibits no discharge. Left eye exhibits no discharge. No scleral icterus.  Neck: Normal range of motion. Neck supple. No tracheal deviation present.  Cardiovascular: Normal rate, regular rhythm and normal heart sounds.  Exam reveals no gallop and no friction rub.   No murmur heard. Pulmonary/Chest: Effort  normal and breath sounds normal. No respiratory distress. He has no wheezes.  Abdominal: Soft. He exhibits no distension. There is no tenderness.  Musculoskeletal: Normal range of motion.  Minimal lumbar paraspinal muscles tender to palpation, no bony tenderness, step-offs, or gross abnormality or deformity of spine, patient is able to ambulate, moves all extremities  Bilateral great toe extension intact Bilateral plantar/dorsiflexion intact  Neurological: He is alert and oriented to person, place, and time. He has normal reflexes.  Sensation and strength intact bilaterally Symmetrical reflexes  Skin: Skin is warm. He is not  diaphoretic.  Psychiatric: He has a normal mood and affect. His behavior is normal. Judgment and thought content normal.  Nursing note and vitals reviewed.   ED Course  Procedures (including critical care time) Labs Review Labs Reviewed - No data to display  Imaging Review Dg Hip Complete Right  11/02/2014   CLINICAL DATA:  Pain for 1 day.  Several recent falls.  EXAM: RIGHT HIP - COMPLETE 2+ VIEW  COMPARISON:  None.  FINDINGS: Frontal pelvis as well as frontal and lateral right hip images were obtained. There is no fracture or dislocation. Joint spaces appear intact. No erosive change.  IMPRESSION: No fracture or dislocation.  Joint spaces appear intact.   Electronically Signed   By: Bretta BangWilliam  Woodruff M.D.   On: 11/02/2014 17:44     EKG Interpretation None      MDM   Final diagnoses:  Sciatica, right    Patient with back pain.  No neurological or strength deficits and normal neuro exam.  Patient is ambulatory.  No loss of bowel or bladder control.  Doubt cauda equina.  Denies fever,  doubt epidural abscess or other lesion. Recommend back exercises, stretching, RICE, and will treat with a short course of prednisone and valium.  Patient able to ambulate.  Will walk with cane so he has support when the pain comes.  Encouraged the patient that there could be a need for additional workup and/or imaging such as MRI, if the symptoms do not resolve. Patient advised that if the back pain does not resolve, or radiates, this could progress to more serious conditions and is encouraged to follow-up with PCP or orthopedics within 2 weeks.        Roxy Horsemanobert Nishant Schrecengost, PA-C 11/02/14 1757  Rolland PorterMark James, MD 11/07/14 [phone]

## 2015-06-05 ENCOUNTER — Emergency Department (HOSPITAL_COMMUNITY)
Admission: EM | Admit: 2015-06-05 | Discharge: 2015-06-06 | Disposition: A | Discharge status: Home / Self Care | Payer: PRIVATE HEALTH INSURANCE | Attending: Emergency Medicine | Admitting: Emergency Medicine

## 2015-06-05 ENCOUNTER — Encounter (HOSPITAL_COMMUNITY): Payer: Self-pay | Admitting: Emergency Medicine

## 2015-06-05 DIAGNOSIS — Z8709 Personal history of other diseases of the respiratory system: Secondary | ICD-10-CM | POA: Insufficient documentation

## 2015-06-05 DIAGNOSIS — Z87438 Personal history of other diseases of male genital organs: Secondary | ICD-10-CM | POA: Insufficient documentation

## 2015-06-05 DIAGNOSIS — M2550 Pain in unspecified joint: Secondary | ICD-10-CM | POA: Insufficient documentation

## 2015-06-05 DIAGNOSIS — Z7952 Long term (current) use of systemic steroids: Secondary | ICD-10-CM | POA: Insufficient documentation

## 2015-06-05 DIAGNOSIS — Z8639 Personal history of other endocrine, nutritional and metabolic disease: Secondary | ICD-10-CM | POA: Insufficient documentation

## 2015-06-05 DIAGNOSIS — L03011 Cellulitis of right finger: Secondary | ICD-10-CM | POA: Insufficient documentation

## 2015-06-05 DIAGNOSIS — Z8619 Personal history of other infectious and parasitic diseases: Secondary | ICD-10-CM | POA: Insufficient documentation

## 2015-06-05 DIAGNOSIS — G8929 Other chronic pain: Secondary | ICD-10-CM | POA: Insufficient documentation

## 2015-06-05 DIAGNOSIS — Z720 Tobacco use: Secondary | ICD-10-CM | POA: Insufficient documentation

## 2015-06-05 DIAGNOSIS — I10 Essential (primary) hypertension: Secondary | ICD-10-CM | POA: Insufficient documentation

## 2015-06-05 NOTE — ED Notes (Signed)
Pt has a reddened raised area on his L thumb x 2 days. Painful. Alert and oriented.

## 2015-06-06 MED ORDER — TRAMADOL HCL 50 MG PO TABS: 50.0000 mg | ORAL_TABLET | Freq: Once | ORAL | Status: DC

## 2015-06-06 MED ORDER — LIDOCAINE HCL (PF) 1 % IJ SOLN
5.0000 mL | Freq: Once | INTRAMUSCULAR | Status: AC
  Administered 2015-06-06: 5 mL
  Filled 2015-06-06: qty 5

## 2015-06-06 MED ORDER — HYDROCODONE-ACETAMINOPHEN 5-325 MG PO TABS: 1.0000 | ORAL_TABLET | Freq: Four times a day (QID) | ORAL | Status: DC | PRN

## 2015-06-06 MED ORDER — TRAMADOL HCL 50 MG PO TABS: 50.0000 mg | ORAL_TABLET | Freq: Four times a day (QID) | ORAL | Status: DC | PRN

## 2015-06-06 MED ORDER — BUPIVACAINE HCL (PF) 0.5 % IJ SOLN
10.0000 mL | Freq: Once | INTRAMUSCULAR | Status: AC
  Administered 2015-06-06: 10 mL
  Filled 2015-06-06: qty 30

## 2015-06-06 NOTE — Discharge Instructions (Signed)
Paronychia Paronychia is an inflammatory reaction involving the folds of the skin surrounding the fingernail. This is commonly caused by an infection in the skin around a nail. The most common cause of paronychia is frequent wetting of the hands (as seen with bartenders, food servers, nurses or others who wet their hands). This makes the skin around the fingernail susceptible to infection by bacteria (germs) or fungus. Other predisposing factors are:  Aggressive manicuring.  Nail biting.  Thumb sucking. The most common cause is a staphylococcal (a type of germ) infection, or a fungal (Candida) infection. When caused by a germ, it usually comes on suddenly with redness, swelling, pus and is often painful. It may get under the nail and form an abscess (collection of pus), or form an abscess around the nail. If the nail itself is infected with a fungus, the treatment is usually prolonged and may require oral medicine for up to one year. Your caregiver will determine the length of time treatment is required. The paronychia caused by bacteria (germs) may largely be avoided by not pulling on hangnails or picking at cuticles. When the infection occurs at the tips of the finger it is called felon. When the cause of paronychia is from the herpes simplex virus (HSV) it is called herpetic whitlow. TREATMENT  When an abscess is present treatment is often incision and drainage. This means that the abscess must be cut open so the pus can get out. When this is done, the following home care instructions should be followed. HOME CARE INSTRUCTIONS   It is important to keep the affected fingers very dry. Rubber or plastic gloves over cotton gloves should be used whenever the hand must be placed in water.  Keep wound clean, dry and dressed as suggested by your caregiver between warm soaks or warm compresses.  Soak in warm water for fifteen to twenty minutes three to four times per day for bacterial infections. Fungal  infections are very difficult to treat, so often require treatment for long periods of time.  For bacterial (germ) infections take antibiotics (medicine which kill germs) as directed and finish the prescription, even if the problem appears to be solved before the medicine is gone.  Only take over-the-counter or prescription medicines for pain, discomfort, or fever as directed by your caregiver. SEEK IMMEDIATE MEDICAL CARE IF:  You have redness, swelling, or increasing pain in the wound.  You notice pus coming from the wound.  You have a fever.  You notice a bad smell coming from the wound or dressing. Document Released: 05/20/2001 Document Revised: 02/16/2012 Document Reviewed: 01/19/2009 [facility] Patient Information 2015 [employer], [place]. This information is not intended to replace advice given to you by your health care provider. Make sure you discuss any questions you have with your health care provider. Please soaking finger in warm saline by mouth 2 or 3 times tomorrow to make this warm saline bath by taking 1/4 teaspoon of salt in a cup of warm water.  Make sure changes with each soak Remove the small piece of packing in 2 days

## 2015-06-06 NOTE — ED Provider Notes (Signed)
CSN: [id number]     Arrival date & time 06/05/15  2221 History   First MD Initiated Contact with Patient 06/06/15 0023     Chief Complaint  Patient presents with  . Hand Pain     (Consider location/radiation/quality/duration/timing/severity/associated sxs/prior Treatment) Patient is a 54 y.o. male presenting with hand pain. The history is provided by the patient.  Hand Pain This is a new problem. The current episode started yesterday. The problem occurs constantly. The problem has been gradually worsening. Associated symptoms include arthralgias. Pertinent negatives include no fever, joint swelling or rash. Nothing aggravates the symptoms. He has tried nothing for the symptoms. The treatment provided no relief.    Past Medical History  Diagnosis Date  . Hypercholesteremia   . Hypertension   . Prostatitis   . Chronic back pain   . Hx of chlamydia infection   . Incidental lung nodule, > 3mm and < 8mm    Past Surgical History  Procedure Laterality Date  . Testicle removal    . Bunionectomy     History reviewed. No pertinent family history. History  Substance Use Topics  . Smoking status: Current Every Day Smoker    Types: Cigarettes  . Smokeless tobacco: Never Used  . Alcohol Use: 1.2 oz/week    2 Cans of beer per week    Review of Systems  Constitutional: Negative for fever.  Musculoskeletal: Positive for arthralgias. Negative for joint swelling.  Skin: Negative for rash and wound.  All other systems reviewed and are negative.     Allergies  Review of patient's allergies indicates no known allergies.  Home Medications   Prior to Admission medications   Medication Sig Start Date End Date Taking? Authorizing Provider  cyclobenzaprine (FLEXERIL) 10 MG tablet Take 1 tablet (10 mg total) by mouth 3 (three) times daily as needed for muscle spasms. Patient not taking: Reported on 11/02/2014 10/04/13   Ivonne Andrew, PA-C  diazepam (VALIUM) 5 MG tablet Take 1 tablet (5  mg total) by mouth every 8 (eight) hours as needed for anxiety. 11/02/14   Roxy Horseman, PA-C  HYDROcodone-acetaminophen (NORCO/VICODIN) 5-325 MG per tablet Take 1-2 tablets by mouth every 6 (six) hours as needed. Patient not taking: Reported on 11/02/2014 06/12/14   Roxy Horseman, PA-C  naproxen (NAPROSYN) 500 MG tablet Take 1 tablet (500 mg total) by mouth 2 (two) times daily. Patient not taking: Reported on 11/02/2014 10/04/13   Ivonne Andrew, PA-C  predniSONE (DELTASONE) 20 MG tablet Take 2 tablets (40 mg total) by mouth daily. 11/02/14   Roxy Horseman, PA-C  sodium chloride (OCEAN) 0.65 % SOLN nasal spray Place 1 spray into both nostrils as needed for congestion. Patient not taking: Reported on 11/02/2014 06/12/14   Roxy Horseman, PA-C  Tamsulosin HCl (FLOMAX) 0.4 MG CAPS Take 1 capsule (0.4 mg total) by mouth daily. Patient not taking: Reported on 11/02/2014 08/06/12   Eber Hong, MD   BP 142/84 mmHg  Pulse 66  Temp(Src) 98.4 F (36.9 C) (Oral)  SpO2 97% Physical Exam  Constitutional: He appears well-developed and well-nourished.  HENT:  Head: Normocephalic.  Eyes: Pupils are equal, round, and reactive to light.  Neck: Normal range of motion.  Cardiovascular: Normal rate.   Pulmonary/Chest: Effort normal.  Musculoskeletal: Normal range of motion.  Neurological: He is alert.  Skin: Skin is warm. There is erythema.  Nursing note and vitals reviewed.   ED Course  INCISION AND DRAINAGE Date/Time: 06/06/2015 1:55 AM Performed by: Earley Favor Authorized by:  Earley FavorSCHULZ, Tationna Fullard Consent: Verbal consent obtained. Written consent obtained. Risks and benefits: risks, benefits and alternatives were discussed Consent given by: patient Patient understanding: patient states understanding of the procedure being performed Patient identity confirmed: verbally with patient Time out: Immediately prior to procedure a "time out" was called to verify the correct patient, procedure, equipment,  support staff and site/side marked as required. Type: abscess Body area: upper extremity Location details: right thumb Anesthesia: digital block Local anesthetic: lidocaine 1% without epinephrine and bupivacaine 0.5% without epinephrine Anesthetic total: 4 ml Patient sedated: no Scalpel size: 11 Needle gauge: 22 Incision type: single straight Complexity: simple Drainage: purulent Drainage amount: scant Wound treatment: wound left open Packing material: 1/4 in iodoform gauze Patient tolerance: Patient tolerated the procedure well with no immediate complications   (including critical care time) Labs Review Labs Reviewed - No data to display  Imaging Review No results found.   EKG Interpretation None      MDM   Final diagnoses:  Paronychia of finger, right         Earley FavorGail Carder Yin, NP 06/06/15 0201  Loren Raceravid Yelverton, MD 06/06/15 2324
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
## 2015-10-03 ENCOUNTER — Encounter (HOSPITAL_COMMUNITY): Payer: Self-pay

## 2015-10-03 ENCOUNTER — Emergency Department (HOSPITAL_COMMUNITY)
Admission: EM | Admit: 2015-10-03 | Discharge: 2015-10-03 | Disposition: A | Discharge status: Home / Self Care | Payer: PRIVATE HEALTH INSURANCE | Attending: Emergency Medicine | Admitting: Emergency Medicine

## 2015-10-03 ENCOUNTER — Emergency Department (HOSPITAL_COMMUNITY): Payer: PRIVATE HEALTH INSURANCE

## 2015-10-03 DIAGNOSIS — Y92410 Unspecified street and highway as the place of occurrence of the external cause: Secondary | ICD-10-CM | POA: Diagnosis not present

## 2015-10-03 DIAGNOSIS — I10 Essential (primary) hypertension: Secondary | ICD-10-CM | POA: Insufficient documentation

## 2015-10-03 DIAGNOSIS — N419 Inflammatory disease of prostate, unspecified: Secondary | ICD-10-CM | POA: Insufficient documentation

## 2015-10-03 DIAGNOSIS — Z8619 Personal history of other infectious and parasitic diseases: Secondary | ICD-10-CM | POA: Insufficient documentation

## 2015-10-03 DIAGNOSIS — Z7952 Long term (current) use of systemic steroids: Secondary | ICD-10-CM | POA: Diagnosis not present

## 2015-10-03 DIAGNOSIS — Z8639 Personal history of other endocrine, nutritional and metabolic disease: Secondary | ICD-10-CM | POA: Insufficient documentation

## 2015-10-03 DIAGNOSIS — S161XXA Strain of muscle, fascia and tendon at neck level, initial encounter: Secondary | ICD-10-CM | POA: Diagnosis not present

## 2015-10-03 DIAGNOSIS — Y9389 Activity, other specified: Secondary | ICD-10-CM | POA: Insufficient documentation

## 2015-10-03 DIAGNOSIS — G8929 Other chronic pain: Secondary | ICD-10-CM | POA: Insufficient documentation

## 2015-10-03 DIAGNOSIS — S79912A Unspecified injury of left hip, initial encounter: Secondary | ICD-10-CM | POA: Insufficient documentation

## 2015-10-03 DIAGNOSIS — Y998 Other external cause status: Secondary | ICD-10-CM | POA: Insufficient documentation

## 2015-10-03 DIAGNOSIS — Z720 Tobacco use: Secondary | ICD-10-CM | POA: Diagnosis not present

## 2015-10-03 DIAGNOSIS — S199XXA Unspecified injury of neck, initial encounter: Secondary | ICD-10-CM | POA: Diagnosis present

## 2015-10-03 HISTORY — DX: Benign prostatic hyperplasia without lower urinary tract symptoms: N40.0

## 2015-10-03 MED ORDER — CYCLOBENZAPRINE HCL 10 MG PO TABS: 10.0000 mg | ORAL_TABLET | Freq: Two times a day (BID) | ORAL | Status: DC | PRN

## 2015-10-03 MED ORDER — ACETAMINOPHEN 325 MG PO TABS
975.0000 mg | ORAL_TABLET | Freq: Once | ORAL | Status: AC
  Administered 2015-10-03: 975 mg via ORAL
  Filled 2015-10-03: qty 3
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
## 2015-10-03 NOTE — Discharge Instructions (Signed)
For pain control you may take up to 800mg  of ibuprofen (that is usually 4 over the counter pills)  3 times a day (take with food) and acetaminophen 975mg  (this is 3 over the counter pills) four times a day. Do not drink alcohol or combine with other medications that have acetaminophen as an ingredient (Read the labels!).    For breakthrough pain you may take Flexeril. Do not drink alcohol, drive or operate heavy machinery when taking Flexeril.  Do not hesitate to return to the emergency room for any new, worsening or concerning symptoms.  Please obtain primary care using resource guide below. Let them know that you were seen in the emergency room and that they will need to obtain records for further outpatient management.    Emergency Department Resource Guide 1) Find a Doctor and Pay Out of Pocket Although you won't have to find out who is covered by your insurance plan, it is a good idea to ask around and get recommendations. You will then need to call the office and see if the doctor you have chosen will accept you as a new patient and what types of options they offer for patients who are self-pay. Some doctors offer discounts or will set up payment plans for their patients who do not have insurance, but you will need to ask so you aren't surprised when you get to your appointment.  2) Contact Your Local Health Department Not all health departments have doctors that can see patients for sick visits, but many do, so it is worth a call to see if yours does. If you don't know where your local health department is, you can check in your phone book. The CDC also has a tool to help you locate your state's health department, and many state websites also have listings of all of their local health departments.  3) Find a Walk-in Clinic If your illness is not likely to be very severe or complicated, you may want to try a walk in clinic. These are popping up all over the country in pharmacies, drugstores,  and shopping centers. They're usually staffed by nurse practitioners or physician assistants that have been trained to treat common illnesses and complaints. They're usually fairly quick and inexpensive. However, if you have serious medical issues or chronic medical problems, these are probably not your best option.  No Primary Care Doctor: - Call Health Connect at  [phone] - they can help you locate a primary care doctor that  accepts your insurance, provides certain services, etc. - Physician Referral Service- [phone]  Chronic Pain Problems: Organization         Address  Phone   Notes  [facility]  [phone] Patients need to be referred by their primary care doctor.   Medication Assistance: Organization         Address  Phone   Notes  [facility] Medication [facility] [address] [place], [place] [address] [phone] --Must be a resident of [facility] -- Must have NO insurance coverage whatsoever (no Medicaid/ Medicare, etc.) -- The pt. MUST have a primary care doctor that directs their care regularly and follows them in the community   MedAssist  [phone]) [phone]   [employer]  [phone]    Agencies that provide inexpensive medical care: Organization         Address  Phone   Notes  [facility]  [phone]   [facility]    [phone]   [facility] [address] [place], [place] [address] [phone]   [facility] [address] [place] [place] [phone]   Planned Parenthood    [phone]   [facility]    [phone]   [facility]  [address], [place] Phone:  [phone], Fax:  [phone] Hours of Operation:  9 am - 6 pm, M-F.  Also accepts Medicaid/Medicare and self-pay.  [facility]  [address], [place] Phone: [phone], Fax: [phone]. Hours of Operation:  8:30 am - 5:30 pm, M-F.  Also accepts Medicaid and self-pay.  [facility] [place] [address] [place] Phone: [phone]   [facility] [address] Natasha Bence [place], [place] [phone], Ext. [phone] Mondays & Thursdays: 7-9 AM.  First 15 patients are seen on a first come, first serve basis.    Medicaid-accepting [facility] Providers:  Organization         Address  Phone   Notes  [facility] [address], [place] [phone] Also accepts self-pay patients.  [facility] [address] Laurell Josephs [place], [place]  [phone]   [facility] [address], [place] [phone]   [facility] [address], [place] [phone]   Renaye Rakers [address], [place]   [phone] Only accepts [place] patients after they have their name applied to their card.   Self-Pay (no insurance) in [facility]:  Organization         Address  Phone   Notes  Sickle Cell Patients, [facility] [address] [place], [place] [phone]   [facility] [address] [place], [place] [phone]   [facility]  [address], [place] [phone]   [facility]/Dr. Osei-Bonsu  [address], [place] or [address], [place] [phone] Phone number for both [place] and [place] locations is the same.  [facility] [address] [place] [phone]   [facility] [address] [place] or [address] [phone]   [facility] [address] [place] [phone], phone; [phone], fax Sees patients 1st and 3rd Saturday of every month.  Must not qualify for public or  private insurance (i.e. Medicaid, Medicare, [place] Health Choice, Veterans' Benefits)  Household income should be no more than 200% of the poverty level The clinic cannot treat you if you are pregnant or think you are pregnant  Sexually transmitted diseases are not treated at the clinic.    Dental Care: Organization         Address  Phone  Notes  [facility] Department of [facility] [address] [place], [place] [phone] Accepts children up to age 52 who are enrolled in [place] or [place] Health Choice; pregnant women with a Medicaid card; and children who have applied for Medicaid or [place] Health Choice, but were declined, whose parents can pay a reduced fee at time of service.  [facility]  [address], [employer] [place] [phone] Accepts children up to age 26 who are enrolled in Medicaid or [place] Health Choice; pregnant women with a Medicaid card; and children who have applied for Medicaid or [place] Health Choice, but were declined, whose parents can pay a reduced fee at time of service.  [facility]  [address] [place], [place] [phone] Patients are seen by appointment only. Walk-ins are not accepted. [facility] will see patients 70 years of age and older. Monday - Tuesday (8am-5pm) Most Wednesdays (8:30-5pm) $30 per visit, cash only  [facility] Access PRO[facility]  [address], [facility] [phone] Patients are seen by appointment only. Walk-ins are not accepted. [facility] will see patients 50 years of age and older. One Wednesday Evening (Monthly: Volunteer Based).  $30 per visit, cash only  [employer]  [phone] for adults; Children under age 88, call Graduate Pediatric Dentistry at [phone]. Children aged 58-14, please call [phone] to request a pediatric application.  Dental services are provided in all areas of dental  care including fillings, crowns and bridges, complete and partial dentures, implants, gum treatment, root canals, and extractions. Preventive care is also provided. Treatment is provided to both adults and children. Patients are selected via a lottery and there is often a waiting list.   [facility] [address] [place]  [phone] [email or web address]   [facility] [address] [place], [place] [phone], Ext. [phone] Second and Fourth Thursday of each month, opens at 6:30 AM; Clinic ends at 9 AM.  Patients are seen on a first-come first-served basis, and a limited number are seen during each clinic.   [facility]  [address] Ether Griffins [place], [place] [phone]   Eligibility Requirements You must have lived in [place], [place], or [place] counties for at least the last three months.   You cannot be eligible for state or federal sponsored [place], including [employer], [place], or [employer].   You generally cannot be eligible for healthcare insurance through your employer.    How to apply: Eligibility screenings are held every Tuesday and Wednesday afternoon from 1:00 pm until 4:00 pm. You do not need an appointment for the interview!  [facility] [address] [place], [place] [phone]   [facility] Health Department  [phone]   [facility] Health Department  [phone]   [facility] Health Department  [phone]    Behavioral Health Resources in the Community: Intensive Outpatient Programs Organization         Address  Phone  Notes  [facility] [address] [place], [place] [phone]   [facility] Outpatient [address] [place], [place] [phone]   [facility]: [facility] [address] [place], [place]  [phone]   [facility] Mental Health [address],  [place], [place] [phone] or [phone]    Substance Abuse Resources Organization         Address  Phone  Notes  Alcohol and Drug Services  [phone]   [facility]  [phone]   [facility] [place]  [phone]   Floydene Flock  [phone]   Residential & Outpatient Substance Abuse Program  [phone]   Psychological Services Organization         Address  Phone  Notes  [facility] Health  [phone]   [facility]  [phone]  [facility] [address], [place] [phone] or [phone]    Mobile Crisis Teams Organization         Address  Phone  Notes  [facility], Mobile Crisis Care Unit  [phone]   [facility]  [address] [place], [place] [phone]   Doristine LocksSharon DeEsch [address] [place] [phone]    Self-Help/Support Groups Organization         Address  Phone             Notes  [facility] of [place] - variety of support groups  336- [phone] Call for more information  Narcotics Anonymous (NA), [facility] [address], [place]  2 meetings at this location   Statisticianesidential Treatment Programs Organization         Address  Phone  Notes  [facility] [address],    [place]  [phone]   [facility]  [address], [place] [phone], [place], [place] [phone]   [facility] [address], [place] [phone] Admissions: 8am-3pm M-F  [facility] [address]    [place], [place] [phone]   [facility] [address], [place], [place] [phone]   [facility] [address]  [place], [place] [phone]   [facility] - Intensive Outpatient [address], [place], [place] [phone]   [facility] ([facility] [address]  [place], [place] [phone] or [phone]   [facility]  Services (RTS) [address] [place], [place] [phone] Accepts Medicaid  [facility] [address]  [place] [phone] Substance Abuse/Addiction Treatment   [facility] Behavioral Health Resources Organization         Address  Phone  Notes  [facility]  [phone]) [phone]   Angie FavaJulie Brannon, PhD [address] [place], [place]   [phone]) [phone] or [phone]) [phone]   [facility] [place]   [address] [place], [place] [phone]   [facility] [address], [place], [place] [phone]) [phone] Insurance/Medicaid/sponsorship through [facility]  [facility] Families [address]                                    [place], [place] [phone] Therapy/tele-psych/case  [facility] [address]   [place], [place] [phone]) [phone]    Dr. Lolly MustacheArfeen  [phone](336) [phone]   [facility] [place]  [employer] [facility] 1) [address], [place] 2) [address], [place] 3)  [address], [place] [phone]) [phone]) [phone]  [phone]   [facility] Child Abuse Hotline [phone](336) [phone] or [phone]) [phone] (After Hours)
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
## 2016-06-09 ENCOUNTER — Emergency Department (HOSPITAL_COMMUNITY)
Admission: EM | Admit: 2016-06-09 | Discharge: 2016-06-09 | Disposition: A | Discharge status: Home / Self Care | Payer: No Typology Code available for payment source | Attending: Emergency Medicine | Admitting: Emergency Medicine

## 2016-06-09 ENCOUNTER — Encounter (HOSPITAL_COMMUNITY): Payer: Self-pay

## 2016-06-09 DIAGNOSIS — L03012 Cellulitis of left finger: Secondary | ICD-10-CM

## 2016-06-09 DIAGNOSIS — I10 Essential (primary) hypertension: Secondary | ICD-10-CM | POA: Insufficient documentation

## 2016-06-09 DIAGNOSIS — Z79899 Other long term (current) drug therapy: Secondary | ICD-10-CM | POA: Insufficient documentation

## 2016-06-09 DIAGNOSIS — L03032 Cellulitis of left toe: Secondary | ICD-10-CM | POA: Insufficient documentation

## 2016-06-09 DIAGNOSIS — F17210 Nicotine dependence, cigarettes, uncomplicated: Secondary | ICD-10-CM | POA: Insufficient documentation

## 2016-06-09 MED ORDER — DOXYCYCLINE HYCLATE 100 MG PO TABS: 100.0000 mg | ORAL_TABLET | Freq: Once | ORAL | Status: DC

## 2016-06-09 MED ORDER — DOXYCYCLINE HYCLATE 100 MG PO TABS
100.0000 mg | ORAL_TABLET | Freq: Once | ORAL | Status: AC
  Administered 2016-06-09: 100 mg via ORAL
  Filled 2016-06-09: qty 1

## 2016-06-09 MED ORDER — CEPHALEXIN 250 MG PO CAPS
500.0000 mg | ORAL_CAPSULE | Freq: Once | ORAL | Status: AC
  Administered 2016-06-09: 500 mg via ORAL
  Filled 2016-06-09: qty 2

## 2016-06-09 MED ORDER — CEPHALEXIN 500 MG PO CAPS: 500.0000 mg | ORAL_CAPSULE | Freq: Two times a day (BID) | ORAL | Status: DC

## 2016-06-09 MED ORDER — LIDOCAINE-EPINEPHRINE 1 %-1:100000 IJ SOLN
10.0000 mL | Freq: Once | INTRAMUSCULAR | Status: AC
  Administered 2016-06-09: 10 mL via INTRADERMAL
  Filled 2016-06-09: qty 1

## 2016-06-09 MED ORDER — HYDROMORPHONE HCL 1 MG/ML IJ SOLN
2.0000 mg | Freq: Once | INTRAMUSCULAR | Status: AC
  Administered 2016-06-09: 2 mg via INTRAMUSCULAR
  Filled 2016-06-09: qty 2

## 2016-06-09 NOTE — Discharge Instructions (Signed)
Paronychia  [name], take antibiotics for 3 days for infection.  See a primary care doctor within 3 days for wound check.  If symptoms worsen, come back to the ED immediately. Thank you. Paronychia is an infection of the skin. It happens near a fingernail or toenail. It may cause pain and swelling around the nail. Usually, it is not serious and it clears up with treatment. HOME CARE  Soak the fingers or toes in warm water as told by your doctor. You may be told to do this for 20 minutes, 2-3 times a day.  Keep the area dry when you are not soaking it.  Take medicines only as told by your doctor.  If you were given an antibiotic medicine, finish all of it even if you start to feel better.  Keep the affected area clean.  Do not try to drain a fluid-filled bump yourself.  Wear rubber gloves when putting your hands in water.  Wear gloves if your hands might touch cleaners or chemicals.  Follow your doctor's instructions about:  Wound care.  Bandage (dressing) changes and removal. GET HELP IF:  Your symptoms get worse or do not improve.  You have a fever or chills.  You have redness spreading from the affected area.  You have more fluid, blood, or pus coming from the affected area.  Your finger or knuckle is swollen or is hard to move.   This information is not intended to replace advice given to you by your health care provider. Make sure you discuss any questions you have with your health care provider.   Document Released: 11/12/2009 Document Revised: 04/10/2015 Document Reviewed: 11/01/2014 Elsevier Interactive Patient Education [employer]2016 [employer] Inc[employer]

## 2016-06-09 NOTE — ED Notes (Signed)
Pt complaining of left big toe hurting toe is red and appears to be slightly swollen. Pt states "I have an infection in the toe"

## 2016-06-09 NOTE — ED Provider Notes (Signed)
CSN: [id number]     Arrival date & time 06/09/16  0346 History   First MD Initiated Contact with Patient 06/09/16 0407     Chief Complaint  Patient presents with  . Foot Pain    left great toe     (Consider location/radiation/quality/duration/timing/severity/associated sxs/prior Treatment) HPI   [name] Is a 55 year old male with no significant past medical history presenting today with toe pain. He states this began today. He has severe throbbing pain that is pulsating in his great toe on the left foot. He states the pain shoots up his foot into his distal tibia. He is concerned there is an infection area. He denies any history of this in the past. He's had no fevers or chills. He states he is otherwise been his normal state of health. He states he has a history of athlete's foot and was walking around with no shoes. He believes this may be the cause. He has no further complaints.  10 Systems reviewed and are negative for acute change except as noted in the HPI.       Past Medical History  Diagnosis Date  . Hypercholesteremia   . Hypertension   . Prostatitis   . Chronic back pain   . Hx of chlamydia infection   . Incidental lung nodule, > 3mm and < 8mm   . BPH (benign prostatic hyperplasia)    Past Surgical History  Procedure Laterality Date  . Testicle removal    . Bunionectomy     Family History  Problem Relation Age of Onset  . Thyroid disease Mother   . Hypertension Mother    Social History  Substance Use Topics  . Smoking status: Current Every Day Smoker -- 1.00 packs/day    Types: Cigarettes  . Smokeless tobacco: Never Used  . Alcohol Use: No    Review of Systems    Allergies  Review of patient's allergies indicates no known allergies.  Home Medications   Prior to Admission medications   Medication Sig Start Date End Date Taking? Authorizing Provider  oxyCODONE-acetaminophen (PERCOCET) 10-325 MG tablet Take 1 tablet by mouth 3 (three) times daily as  needed for pain.  05/15/16  Yes Historical Provider, MD  Tamsulosin HCl (FLOMAX) 0.4 MG CAPS Take 1 capsule (0.4 mg total) by mouth daily. 08/06/12  Yes Eber HongBrian Miller, MD  cyclobenzaprine (FLEXERIL) 10 MG tablet Take 1 tablet (10 mg total) by mouth 2 (two) times daily as needed for muscle spasms. Patient not taking: Reported on 06/09/2016 10/03/15   Joni ReiningNicole Pisciotta, PA-C  diazepam (VALIUM) 5 MG tablet Take 1 tablet (5 mg total) by mouth every 8 (eight) hours as needed for anxiety. Patient not taking: Reported on 06/09/2016 11/02/14   Roxy Horsemanobert Browning, PA-C  HYDROcodone-acetaminophen (NORCO/VICODIN) 5-325 MG per tablet Take 1 tablet by mouth every 6 (six) hours as needed for moderate pain. Patient not taking: Reported on 06/09/2016 06/06/15   Earley FavorGail Schulz, NP  naproxen (NAPROSYN) 500 MG tablet Take 1 tablet (500 mg total) by mouth 2 (two) times daily. Patient not taking: Reported on 11/02/2014 10/04/13   Ivonne AndrewPeter Dammen, PA-C  predniSONE (DELTASONE) 20 MG tablet Take 2 tablets (40 mg total) by mouth daily. Patient not taking: Reported on 06/09/2016 11/02/14   Roxy Horsemanobert Browning, PA-C  sodium chloride (OCEAN) 0.65 % SOLN nasal spray Place 1 spray into both nostrils as needed for congestion. Patient not taking: Reported on 11/02/2014 06/12/14   Roxy Horsemanobert Browning, PA-C  traMADol (ULTRAM) 50 MG tablet Take 1  tablet (50 mg total) by mouth every 6 (six) hours as needed. Patient not taking: Reported on 06/09/2016 06/06/15   Earley FavorGail Schulz, NP   BP 150/87 mmHg  Pulse 86  Temp(Src) 98.6 F (37 C) (Oral)  Resp 19  Ht 5\' 7"  (1.702 m)  Wt 185 lb (83.915 kg)  BMI 28.97 kg/m2  SpO2 96% Physical Exam  Constitutional: He is oriented to person, place, and time. Vital signs are normal. He appears well-developed and well-nourished.  Non-toxic appearance. He does not appear ill. No distress.  HENT:  Head: Normocephalic and atraumatic.  Nose: Nose normal.  Mouth/Throat: Oropharynx is clear and moist. No oropharyngeal exudate.  Eyes:  Conjunctivae and EOM are normal. Pupils are equal, round, and reactive to light. No scleral icterus.  Neck: Normal range of motion. Neck supple. No tracheal deviation, no edema, no erythema and normal range of motion present. No thyroid mass and no thyromegaly present.  Cardiovascular: Normal rate, regular rhythm, S1 normal, S2 normal, normal heart sounds, intact distal pulses and normal pulses.  Exam reveals no gallop and no friction rub.   No murmur heard. Pulmonary/Chest: Effort normal and breath sounds normal. No respiratory distress. He has no wheezes. He has no rhonchi. He has no rales.  Abdominal: Soft. Normal appearance and bowel sounds are normal. He exhibits no distension, no ascites and no mass. There is no hepatosplenomegaly. There is no tenderness. There is no rebound, no guarding and no CVA tenderness.  Musculoskeletal: Normal range of motion. He exhibits no edema or tenderness.  Left great toe is diffusely swollen, there is purulence at the nailbed. There is no tenderness to palpation. No erythema or warmth.  Lymphadenopathy:    He has no cervical adenopathy.  Neurological: He is alert and oriented to person, place, and time. He has normal strength. No cranial nerve deficit or sensory deficit.  Skin: Skin is warm, dry and intact. No petechiae and no rash noted. He is not diaphoretic. No erythema. No pallor.  Psychiatric: He has a normal mood and affect. His behavior is normal. Judgment normal.  Nursing note and vitals reviewed.   ED Course  Procedures (including critical care time) Labs Review Labs Reviewed - No data to display  Imaging Review No results found. I have personally reviewed and evaluated these images and lab results as part of my medical decision-making.   EKG Interpretation None      MDM   Final diagnoses:  None    Patient presents to the ED for pain at great toe.  PE is consistent with paronychia.  Will perform I&D.  He was given dilaudid for pain  control.  Also keflex and doxycycline for coverage.   INCISION AND DRAINAGE Performed by: Tomasita CrumbleNI,Vora Clover Consent: Verbal consent obtained. Risks and benefits: risks, benefits and alternatives were discussed Type: abscess  Body area: L toe  Incision was made with a scalpel.  Complexity: complex Blunt dissection to break up loculations  Drainage: purulent  Drainage amount: 5cc  Patient tolerance: Patient tolerated the procedure well with no immediate complications.     Tomasita CrumbleAdeleke Harol Shabazz, MD 06/09/16 [phone]

## 2016-07-26 ENCOUNTER — Emergency Department (HOSPITAL_COMMUNITY)
Admission: EM | Admit: 2016-07-26 | Discharge: 2016-07-26 | Disposition: A | Discharge status: Home / Self Care | Payer: PRIVATE HEALTH INSURANCE | Attending: Emergency Medicine | Admitting: Emergency Medicine

## 2016-07-26 ENCOUNTER — Encounter (HOSPITAL_COMMUNITY): Payer: Self-pay | Admitting: Emergency Medicine

## 2016-07-26 DIAGNOSIS — B353 Tinea pedis: Secondary | ICD-10-CM | POA: Insufficient documentation

## 2016-07-26 DIAGNOSIS — Z792 Long term (current) use of antibiotics: Secondary | ICD-10-CM | POA: Insufficient documentation

## 2016-07-26 DIAGNOSIS — F17210 Nicotine dependence, cigarettes, uncomplicated: Secondary | ICD-10-CM | POA: Insufficient documentation

## 2016-07-26 DIAGNOSIS — Z79899 Other long term (current) drug therapy: Secondary | ICD-10-CM | POA: Insufficient documentation

## 2016-07-26 DIAGNOSIS — I10 Essential (primary) hypertension: Secondary | ICD-10-CM | POA: Insufficient documentation

## 2016-07-26 MED ORDER — FLUCONAZOLE 150 MG PO TABS: 150.0000 mg | ORAL_TABLET | ORAL | 0 refills | Status: AC

## 2016-07-26 MED ORDER — NYSTATIN 100000 UNIT/GM EX POWD: Freq: Three times a day (TID) | CUTANEOUS | 0 refills | Status: DC

## 2016-07-26 NOTE — ED Provider Notes (Signed)
[facility]-EMERGENCY DEPT Provider Note   CSN: [id number] Arrival date & time: 07/26/16  0306    History   Chief Complaint Chief Complaint  Patient presents with  . Rash    HPI Douglas Glass is a 55 y.o. male.  The history is provided by the patient. No language interpreter was used.  Rash   This is a chronic problem. Episode onset: "I've had it for years" Progression since onset: waxing and waning. Associated with: Perspiration of feet. There has been no fever. The rash is present on the right foot and left foot. The patient is experiencing no pain. Associated symptoms include itching. Pertinent negatives include no weeping. Treatments tried: OTC powders. The treatment provided no relief.    Past Medical History:  Diagnosis Date  . BPH (benign prostatic hyperplasia)   . Chronic back pain   . Hx of chlamydia infection   . Hypercholesteremia   . Hypertension   . Incidental lung nodule, > 3mm and < 8mm   . Prostatitis     Patient Active Problem List   Diagnosis Date Noted  . Chest pain 01/06/2012  . MENISCUS TEAR 04/18/2010  . MICROALBUMINURIA 09/25/2009  . 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 URINARY URGENCY 09/19/2009  . HYPERGLYCEMIA 09/19/2009  . FOOT PAIN 05/11/2009  . URETHRITIS 03/23/2009  . TOBACCO ABUSE 09/20/2008  . DYSURIA 09/20/2008  . CARPAL TUNNEL SYNDROME, BILATERAL 06/21/2008  . ALLERGIC RHINITIS 03/27/2008  . DENTAL PAIN 03/27/2008  . UNSPECIFIED DISORDER OF PENIS 12/31/2007  . ORCHIECTOMY, HX OF 10/22/2007  . DENTAL CARIES 09/24/2007  . BUNIONS, BILATERAL 09/24/2007    Past Surgical History:  Procedure Laterality Date  . BUNIONECTOMY    . TESTICLE REMOVAL         Home Medications    Prior to Admission medications   Medication Sig Start Date End Date Taking? Authorizing Provider  cephALEXin (KEFLEX) 500 MG capsule Take 1 capsule (500 mg total) by mouth 2 (two) times daily. 06/09/16   Tomasita CrumbleAdeleke Oni, MD  doxycycline  (VIBRA-TABS) 100 MG tablet Take 1 tablet (100 mg total) by mouth once. 06/09/16   Tomasita CrumbleAdeleke Oni, MD  fluconazole (DIFLUCAN) 150 MG tablet Take 1 tablet (150 mg total) by mouth every 7 (seven) days. 07/26/16 08/31/16  Antony MaduraKelly Zabdi Mis, PA-C  nystatin (MYCOSTATIN/NYSTOP) powder Apply topically 3 (three) times daily. 07/26/16   Antony MaduraKelly Cassondra Stachowski, PA-C  oxyCODONE-acetaminophen (PERCOCET) 10-325 MG tablet Take 1 tablet by mouth 3 (three) times daily as needed for pain.  05/15/16   Historical Provider, MD  Tamsulosin HCl (FLOMAX) 0.4 MG CAPS Take 1 capsule (0.4 mg total) by mouth daily. 08/06/12   Eber HongBrian Miller, MD    Family History Family History  Problem Relation Age of Onset  . Thyroid disease Mother   . Hypertension Mother     Social History Social History  Substance Use Topics  . Smoking status: Current Every Day Smoker    Packs/day: 1.00    Types: Cigarettes  . Smokeless tobacco: Never Used  . Alcohol use No     Allergies   Review of patient's allergies indicates no known allergies.   Review of Systems Review of Systems  Constitutional: Negative for fever.  Skin: Positive for itching and rash.  Ten systems reviewed and are negative for acute change, except as noted in the HPI.    Physical Exam Updated Vital Signs BP 160/93 (BP Location: Left Arm)   Pulse 75   Temp 98.9 F (  37.2 C) (Oral)   Resp 17   Ht 5\' 7"  (1.702 m)   Wt 81.6 kg   SpO2 98%   BMI 28.19 kg/m   Physical Exam  Constitutional: He is oriented to person, place, and time. He appears well-developed and well-nourished. No distress.  Nontoxic appearing  HENT:  Head: Normocephalic and atraumatic.  Eyes: Conjunctivae and EOM are normal. No scleral icterus.  Neck: Normal range of motion.  Pulmonary/Chest: Effort normal. No respiratory distress.  Musculoskeletal: Normal range of motion.  Neurological: He is alert and oriented to person, place, and time.  Ambulatory with steady gait.  Skin: Skin is warm and dry. Rash noted.  He is not diaphoretic. No erythema. No pallor.  Macular, planar, dry rash noted to plantar aspect of feet. There is cracking to the skin in between digits where skin appears moist. No weeping or drainage. No heat to touch or purulence.  Psychiatric: He has a normal mood and affect. His behavior is normal.  Nursing note and vitals reviewed.    ED Treatments / Results  Labs (all labs ordered are listed, but only abnormal results are displayed) Labs Reviewed - No data to display  EKG  EKG Interpretation None       Radiology No results found.  Procedures Procedures (including critical care time)  Medications Ordered in ED Medications - No data to display   Initial Impression / Assessment and Plan / ED Course  I have reviewed the triage vital signs and the nursing notes.  Pertinent labs & imaging results that were available during my care of the patient were reviewed by me and considered in my medical decision making (see chart for details).  Clinical Course    55 year old male presents to the emergency department for evaluation of symptoms consistent with tinea pedis. Symptoms chronic; "for years". Will manage supportively. No evidence of secondary infection or cellulitis. Patient referred to podiatry. Return precautions provided. Patient discharged in satisfactory condition with no unaddressed concerns.   Final Clinical Impressions(s) / ED Diagnoses   Final diagnoses:  Tinea pedis of both feet    New Prescriptions Discharge Medication List as of 07/26/2016  3:57 AM    START taking these medications   Details  fluconazole (DIFLUCAN) 150 MG tablet Take 1 tablet (150 mg total) by mouth every 7 (seven) days., Starting Sat 07/26/2016, Until Sun 08/31/2016, Print    nystatin (MYCOSTATIN/NYSTOP) powder Apply topically 3 (three) times daily., Starting Sat 07/26/2016, Print         WoosterKelly Alizzon Dioguardi, PA-C 07/26/16 0424    Lyndal Pulleyaniel Knott, MD 07/28/16 [phone]

## 2016-07-26 NOTE — ED Triage Notes (Signed)
Pt c/o itching, ?rash to feet and between legs.

## 2016-07-26 NOTE — Discharge Instructions (Signed)
Use Nystatin powder as prescribed and take Diflucan as prescribed. Try to keep your feet dry. Do not soak in water. Change socks frequently.

## 2017-06-01 ENCOUNTER — Emergency Department (HOSPITAL_COMMUNITY)
Admission: EM | Admit: 2017-06-01 | Discharge: 2017-06-01 | Disposition: A | Discharge status: Home / Self Care | Payer: PRIVATE HEALTH INSURANCE | Attending: Emergency Medicine | Admitting: Emergency Medicine

## 2017-06-01 ENCOUNTER — Encounter (HOSPITAL_COMMUNITY): Payer: Self-pay | Admitting: *Deleted

## 2017-06-01 DIAGNOSIS — L662 Folliculitis decalvans: Secondary | ICD-10-CM | POA: Insufficient documentation

## 2017-06-01 DIAGNOSIS — I10 Essential (primary) hypertension: Secondary | ICD-10-CM | POA: Insufficient documentation

## 2017-06-01 DIAGNOSIS — F17210 Nicotine dependence, cigarettes, uncomplicated: Secondary | ICD-10-CM | POA: Insufficient documentation

## 2017-06-01 DIAGNOSIS — L739 Follicular disorder, unspecified: Secondary | ICD-10-CM

## 2017-06-01 MED ORDER — MUPIROCIN CALCIUM 2 % EX CREA: 1.0000 "application " | TOPICAL_CREAM | Freq: Two times a day (BID) | CUTANEOUS | 0 refills | Status: DC

## 2017-06-01 MED ORDER — DIPHENHYDRAMINE HCL 25 MG PO CAPS: 25.0000 mg | ORAL_CAPSULE | Freq: Four times a day (QID) | ORAL | 0 refills | Status: DC | PRN

## 2017-06-01 NOTE — Discharge Instructions (Signed)
Take benadryl for itching as prescribed as needed. Apply Bactroban cream to the lesions. If you believe you have bedbugs, he must get rid of their mattress, wash her clothes, and treat the household, or you can move to a different location. In order to get a cane, [facility] or [facility].

## 2017-06-01 NOTE — ED Notes (Signed)
Pt states there are bedbugs, thousands of them, where he is staying and he states he get bitten and itches, he asked what he could do,  We told him he was going to have  To let the owner know and they would have to get rid of them

## 2017-06-01 NOTE — ED Notes (Signed)
Wheeled patient to lobby after discharge and offered to wheel him to bus stop. Patient states "I'm probably going to fall when I leave here". Patient refused to allow me to wheel him to bus stop and wanted to get out of the wheel chair and walk to vending machines.  Patient ambulated to vending machine without difficulty, normal gait.  Obtained product out of machine without difficulty, bending over to get product out of machine.  No distress noted.  Nurse first made aware to keep monitoring patient.
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
## 2017-06-01 NOTE — ED Triage Notes (Signed)
To ED for eval of insect bites. States he thinks his home has bedbugs. No bugs seen. With red - rash like areas to body. States he has called landlord and he is supposed to be 'spraying' but nothing has happened.

## 2017-06-30 ENCOUNTER — Ambulatory Visit (HOSPITAL_COMMUNITY)
Admission: EM | Admit: 2017-06-30 | Discharge: 2017-06-30 | Disposition: A | Discharge status: Home / Self Care | Payer: PRIVATE HEALTH INSURANCE | Attending: Family Medicine | Admitting: Family Medicine

## 2017-06-30 ENCOUNTER — Encounter (HOSPITAL_COMMUNITY): Payer: Self-pay | Admitting: Family Medicine

## 2017-06-30 DIAGNOSIS — M5489 Other dorsalgia: Secondary | ICD-10-CM

## 2017-06-30 DIAGNOSIS — G894 Chronic pain syndrome: Secondary | ICD-10-CM

## 2017-06-30 DIAGNOSIS — Z760 Encounter for issue of repeat prescription: Secondary | ICD-10-CM

## 2017-06-30 DIAGNOSIS — G8929 Other chronic pain: Secondary | ICD-10-CM

## 2017-06-30 MED ORDER — OXYCODONE-ACETAMINOPHEN 10-325 MG PO TABS: 1.0000 | ORAL_TABLET | Freq: Three times a day (TID) | ORAL | 0 refills | Status: DC | PRN

## 2017-06-30 NOTE — ED Provider Notes (Addendum)
CSN: [id number]     Arrival date & time 06/30/17  1130 History   None    Chief Complaint  Patient presents with  . Medication Refill   (Consider location/radiation/quality/duration/timing/severity/associated sxs/prior Treatment) Patient is requesting refill on his percocet.  Patient states he was told to come here and get a refill on his percocet because his doctor is sick and out of work today. He has chronic back pain and takes percocet 10mg  one po tid.   The history is provided by the patient.  Back Pain  Location:  Lumbar spine Quality:  Aching Radiates to:  Does not radiate Pain is:  Worse during the day Timing:  Constant Chronicity:  Chronic   Past Medical History:  Diagnosis Date  . BPH (benign prostatic hyperplasia)   . Chronic back pain   . Hx of chlamydia infection   . Hypercholesteremia   . Hypertension   . Incidental lung nodule, > 3mm and < 8mm   . Prostatitis    Past Surgical History:  Procedure Laterality Date  . BUNIONECTOMY    . TESTICLE REMOVAL     Family History  Problem Relation Age of Onset  . Thyroid disease Mother   . Hypertension Mother    Social History  Substance Use Topics  . Smoking status: Current Every Day Smoker    Packs/day: 1.00    Types: Cigarettes  . Smokeless tobacco: Never Used  . Alcohol use No    Review of Systems  Constitutional: Negative.   HENT: Negative.   Eyes: Negative.   Respiratory: Negative.   Cardiovascular: Negative.   Gastrointestinal: Negative.   Endocrine: Negative.   Genitourinary: Negative.   Musculoskeletal: Positive for back pain.  Skin: Negative.   Allergic/Immunologic: Negative.   Neurological: Negative.   Hematological: Negative.   Psychiatric/Behavioral: Negative.     Allergies  Patient has no known allergies.  Home Medications   Prior to Admission medications   Medication Sig Start Date End Date Taking? Authorizing Provider  cephALEXin (KEFLEX) 500 MG capsule Take 1 capsule (500 mg  total) by mouth 2 (two) times daily. 06/09/16   Tomasita Crumble, MD  diphenhydrAMINE (BENADRYL) 25 mg capsule Take 1 capsule (25 mg total) by mouth every 6 (six) hours as needed. 06/01/17   Kirichenko, Lemont Fillers, PA-C  doxycycline (VIBRA-TABS) 100 MG tablet Take 1 tablet (100 mg total) by mouth once. 06/09/16   Tomasita Crumble, MD  mupirocin cream (BACTROBAN) 2 % Apply 1 application topically 2 (two) times daily. 06/01/17   Kirichenko, Lemont Fillers, PA-C  nystatin (MYCOSTATIN/NYSTOP) powder Apply topically 3 (three) times daily. 07/26/16   Antony Madura, PA-C  oxyCODONE-acetaminophen (PERCOCET) 10-325 MG tablet Take 1 tablet by mouth 3 (three) times daily as needed for pain.  05/15/16   [provider]  oxyCODONE-acetaminophen (PERCOCET) 10-325 MG tablet Take 1 tablet by mouth every 8 (eight) hours as needed for pain. 06/30/17   Deatra Canter, FNP  Tamsulosin HCl (FLOMAX) 0.4 MG CAPS Take 1 capsule (0.4 mg total) by mouth daily. 08/06/12   Eber Hong, MD   Meds Ordered and Administered this Visit  Medications - No data to display  BP (!) 185/105 (BP Location: Right Arm)   Pulse 68   Temp 98.4 F (36.9 C)   Resp 18   SpO2 100%  No data found.   Physical Exam  Constitutional: He appears well-developed and well-nourished.  HENT:  Head: Normocephalic and atraumatic.  Right Ear: External ear normal.  Left Ear: External ear  normal.  Mouth/Throat: Oropharynx is clear and moist.  Eyes: Pupils are equal, round, and reactive to light. Conjunctivae and EOM are normal.  Neck: Normal range of motion. Neck supple.  Cardiovascular: Normal rate, regular rhythm and normal heart sounds.   Pulmonary/Chest: Effort normal and breath sounds normal.  Musculoskeletal: He exhibits tenderness.  TTP lumbar spine  Nursing note and vitals reviewed.   Urgent Care Course     Procedures (including critical care time)  Labs Review Labs Reviewed - No data to display  Imaging Review No results found.   Visual  Acuity Review  Right Eye Distance:   Left Eye Distance:   Bilateral Distance:    Right Eye Near:   Left Eye Near:    Bilateral Near:         MDM   1. Medication refill   2. Chronic pain syndrome    [place] CSR accessed and results reveal patient receiving monthly prescriptions for Percocet 10mg  po tid from Dr. Billee CashingWayland Mckenzie.  Last rx was 05/28/17.  I attempted to call Dr. Ronne BinningMckenzie office but no answer or answering service available.  Patient states he did call them prior to coming over and was told by staff to come to Urgent Care for refill because Dr. Ronne BinningMckenzie was sick and unable to come and fill his rx todyay according to patient.    Percocet 10mg  one po tid #15  Follow up with your pain provider      Deatra CanterOxford, Caleigh Rabelo J, FNP 06/30/17 1222    Deatra Canterxford, Marquasha Brutus J, FNP 06/30/17 1226

## 2017-06-30 NOTE — ED Triage Notes (Signed)
Pt sts that he needs refill on percocet 10-325

## 2017-06-30 NOTE — Discharge Instructions (Signed)
I am unable to rx anymore than 5 days of this medicine to you.  Please call Dr. Billee CashingWayland Mckenzie office to get your pain medicine after this.

## 2017-08-04 ENCOUNTER — Encounter (HOSPITAL_COMMUNITY): Payer: Self-pay | Admitting: Emergency Medicine

## 2017-08-04 ENCOUNTER — Emergency Department (HOSPITAL_COMMUNITY)
Admission: EM | Admit: 2017-08-04 | Discharge: 2017-08-04 | Disposition: A | Discharge status: Home / Self Care | Payer: Self-pay | Attending: Emergency Medicine | Admitting: Emergency Medicine

## 2017-08-04 ENCOUNTER — Encounter: Payer: Self-pay | Admitting: Pediatric Intensive Care

## 2017-08-04 DIAGNOSIS — F17210 Nicotine dependence, cigarettes, uncomplicated: Secondary | ICD-10-CM | POA: Insufficient documentation

## 2017-08-04 DIAGNOSIS — L299 Pruritus, unspecified: Secondary | ICD-10-CM | POA: Insufficient documentation

## 2017-08-04 DIAGNOSIS — Z79899 Other long term (current) drug therapy: Secondary | ICD-10-CM | POA: Insufficient documentation

## 2017-08-04 DIAGNOSIS — R21 Rash and other nonspecific skin eruption: Secondary | ICD-10-CM | POA: Insufficient documentation

## 2017-08-04 DIAGNOSIS — I10 Essential (primary) hypertension: Secondary | ICD-10-CM | POA: Insufficient documentation

## 2017-08-04 MED ORDER — DIPHENHYDRAMINE HCL 25 MG PO CAPS: 25.0000 mg | ORAL_CAPSULE | Freq: Four times a day (QID) | ORAL | 0 refills | Status: DC | PRN

## 2017-08-04 MED ORDER — DIPHENHYDRAMINE HCL 25 MG PO CAPS
50.0000 mg | ORAL_CAPSULE | Freq: Once | ORAL | Status: AC
  Administered 2017-08-04: 50 mg via ORAL
  Filled 2017-08-04: qty 2

## 2017-08-04 MED ORDER — CEPHALEXIN 500 MG PO CAPS: 500.0000 mg | ORAL_CAPSULE | Freq: Four times a day (QID) | ORAL | 0 refills | Status: DC

## 2017-08-04 MED ORDER — PERMETHRIN 5 % EX CREA: TOPICAL_CREAM | CUTANEOUS | 0 refills | Status: DC

## 2017-08-04 MED ORDER — CEPHALEXIN 500 MG PO CAPS
500.0000 mg | ORAL_CAPSULE | Freq: Once | ORAL | Status: AC
  Administered 2017-08-04: 500 mg via ORAL
  Filled 2017-08-04: qty 1

## 2017-08-04 MED FILL — BANOPHEN 25 MG TABLET: 25 | 25 days supply | Qty: 100 | Fill #0

## 2017-08-04 MED FILL — MUPIROCIN 2% CREAM: 2 | 10 days supply | Qty: 30 | Fill #0
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
## 2017-08-04 NOTE — ED Triage Notes (Signed)
Pt states he lived in a house in Feb that he thought was infested with bed bugs so he moved to [facility] he has bites all over him and he states he pulls bugs off of him all the time  Pt states he is itching all over Pt has been using Psorent, psoriasis topical solution but it is not helping

## 2017-08-07 MED FILL — CEPHALEXIN 500 MG CAPSULE: 500 | 7 days supply | Qty: 28 | Fill #0

## 2017-08-07 MED FILL — PERMETHRIN 5% CREAM: 5 | 1 days supply | Qty: 60 | Fill #0

## 2017-08-11 ENCOUNTER — Encounter: Payer: Self-pay | Admitting: Pediatric Intensive Care

## 2017-08-24 NOTE — Congregational Nurse Program (Signed)
Congregational Nurse Program Note  Date of Encounter: 08/04/2017  Past Medical History: Past Medical History:  Diagnosis Date  . BPH (benign prostatic hyperplasia)   . Chronic back pain   . Hx of chlamydia infection   . Hypercholesteremia   . Hypertension   . Incidental lung nodule, > 3mm and < 8mm   . Prostatitis     Encounter Details:     CNP Questionnaire - 08/04/17 1000      Patient Demographics   Is this a new or existing patient? New   Patient is considered a/an Not Applicable   Race African-American/Black     Patient Assistance   Location of Patient Assistance [facility]   Patient's financial/insurance status Self-Pay (Uninsured);Low Income   Uninsured Patient ([employer]) Yes   Interventions Not Applicable   Patient referred to apply for the following financial assistance Not Applicable   Food insecurities addressed Not Applicable   Transportation assistance No   Type of Assistance [facility]   Educational health offerings Medications     Encounter Details   Primary purpose of visit Education/Health Concerns   Was an Emergency Department visit averted? Not Applicable   Does patient have a medical provider? No   Patient referred to Establish PCP   Was a mental health screening completed? (GAINS tool) No   Does patient have dental issues? No   Does patient have vision issues? No   Does your patient have an abnormal blood pressure today? No   Since previous encounter, have you referred patient for abnormal blood pressure that resulted in a new diagnosis or medication change? No   Does your patient have an abnormal blood glucose today? No   Since previous encounter, have you referred patient for abnormal blood glucose that resulted in a new diagnosis or medication change? No   Was there a life-saving intervention made? No    Client has rash on arms. Needs medication fill from [facility].

## 2017-09-09 NOTE — Congregational Nurse Program (Signed)
Congregational Nurse Program Note  Date of Encounter: 08/11/2017  Past Medical History: Past Medical History:  Diagnosis Date  . BPH (benign prostatic hyperplasia)   . Chronic back pain   . Hx of chlamydia infection   . Hypercholesteremia   . Hypertension   . Incidental lung nodule, > 3mm and < 8mm   . Prostatitis     Encounter Details:     CNP Questionnaire - 08/11/17 0930      Patient Demographics   Is this a new or existing patient? Existing   Patient is considered a/an Not Applicable   Race African-American/Black     Patient Assistance   Location of Patient Assistance [facility]   Patient's financial/insurance status Self-Pay (Uninsured);Low Income   Uninsured Patient ([employer]) Yes   Interventions Counseled to make appt. with provider   Patient referred to apply for the following financial assistance Not Applicable   Food insecurities addressed Not Applicable   Transportation assistance No   Assistance securing medications No   Type of Assistance Other   Educational health offerings Medications;Chronic disease     Encounter Details   Primary purpose of visit Chronic Illness/Condition Visit;Education/Health Concerns   Was an Emergency Department visit averted? Not Applicable   Does patient have a medical provider? Yes   Patient referred to Follow up with established PCP   Was a mental health screening completed? (GAINS tool) No   Does patient have dental issues? No   Does patient have vision issues? No   Does your patient have an abnormal blood pressure today? No   Since previous encounter, have you referred patient for abnormal blood pressure that resulted in a new diagnosis or medication change? No   Does your patient have an abnormal blood glucose today? No   Since previous encounter, have you referred patient for abnormal blood glucose that resulted in a new diagnosis or medication change? No   Was there a life-saving intervention made? No      Follow up for BP check. Client received his medication. CN encouraged client to follow up with PCP instead of going to ED for non-acute issues.

## 2017-12-19 ENCOUNTER — Emergency Department (HOSPITAL_COMMUNITY)
Admission: EM | Admit: 2017-12-19 | Discharge: 2017-12-19 | Disposition: A | Discharge status: Home / Self Care | Payer: No Typology Code available for payment source | Attending: Emergency Medicine | Admitting: Emergency Medicine

## 2017-12-19 ENCOUNTER — Emergency Department (HOSPITAL_COMMUNITY): Payer: No Typology Code available for payment source

## 2017-12-19 ENCOUNTER — Encounter (HOSPITAL_COMMUNITY): Payer: Self-pay | Admitting: Emergency Medicine

## 2017-12-19 DIAGNOSIS — F17210 Nicotine dependence, cigarettes, uncomplicated: Secondary | ICD-10-CM | POA: Diagnosis not present

## 2017-12-19 DIAGNOSIS — Z79899 Other long term (current) drug therapy: Secondary | ICD-10-CM | POA: Diagnosis not present

## 2017-12-19 DIAGNOSIS — S43005A Unspecified dislocation of left shoulder joint, initial encounter: Secondary | ICD-10-CM | POA: Diagnosis not present

## 2017-12-19 DIAGNOSIS — Y999 Unspecified external cause status: Secondary | ICD-10-CM | POA: Insufficient documentation

## 2017-12-19 DIAGNOSIS — Y9389 Activity, other specified: Secondary | ICD-10-CM | POA: Insufficient documentation

## 2017-12-19 DIAGNOSIS — S4992XA Unspecified injury of left shoulder and upper arm, initial encounter: Secondary | ICD-10-CM | POA: Diagnosis present

## 2017-12-19 DIAGNOSIS — I10 Essential (primary) hypertension: Secondary | ICD-10-CM | POA: Insufficient documentation

## 2017-12-19 DIAGNOSIS — Y92410 Unspecified street and highway as the place of occurrence of the external cause: Secondary | ICD-10-CM | POA: Insufficient documentation

## 2017-12-19 MED ORDER — OXYCODONE-ACETAMINOPHEN 5-325 MG PO TABS
1.0000 | ORAL_TABLET | Freq: Once | ORAL | Status: AC
  Administered 2017-12-19: 1 via ORAL
  Filled 2017-12-19: qty 1

## 2017-12-19 MED ORDER — METAXALONE 800 MG PO TABS: 800.0000 mg | ORAL_TABLET | Freq: Three times a day (TID) | ORAL | 0 refills | Status: DC

## 2017-12-19 MED ORDER — DIAZEPAM 5 MG PO TABS
5.0000 mg | ORAL_TABLET | Freq: Once | ORAL | Status: AC
  Administered 2017-12-19: 5 mg via ORAL
  Filled 2017-12-19: qty 1

## 2017-12-19 MED ORDER — OXYCODONE-ACETAMINOPHEN 5-325 MG PO TABS: 2.0000 | ORAL_TABLET | ORAL | 0 refills | Status: DC | PRN

## 2017-12-19 NOTE — ED Notes (Signed)
Called pt for triage no response. 
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
## 2018-02-25 ENCOUNTER — Ambulatory Visit (INDEPENDENT_AMBULATORY_CARE_PROVIDER_SITE_OTHER): Payer: Self-pay | Admitting: Physician Assistant

## 2018-03-17 ENCOUNTER — Encounter (INDEPENDENT_AMBULATORY_CARE_PROVIDER_SITE_OTHER): Payer: Self-pay | Admitting: Physician Assistant

## 2018-03-17 ENCOUNTER — Ambulatory Visit (INDEPENDENT_AMBULATORY_CARE_PROVIDER_SITE_OTHER): Payer: Self-pay | Admitting: Physician Assistant

## 2018-03-17 VITALS — BP 154/91 | HR 71 | Temp 97.8°F | Resp 16 | Ht 67.0 in | Wt 239.8 lb

## 2018-03-17 DIAGNOSIS — G8929 Other chronic pain: Secondary | ICD-10-CM

## 2018-03-17 DIAGNOSIS — N529 Male erectile dysfunction, unspecified: Secondary | ICD-10-CM

## 2018-03-17 DIAGNOSIS — L989 Disorder of the skin and subcutaneous tissue, unspecified: Secondary | ICD-10-CM

## 2018-03-17 DIAGNOSIS — I10 Essential (primary) hypertension: Secondary | ICD-10-CM

## 2018-03-17 DIAGNOSIS — M25551 Pain in right hip: Secondary | ICD-10-CM

## 2018-03-17 DIAGNOSIS — N3943 Post-void dribbling: Secondary | ICD-10-CM

## 2018-03-17 DIAGNOSIS — Z79899 Other long term (current) drug therapy: Secondary | ICD-10-CM

## 2018-03-17 DIAGNOSIS — W57XXXA Bitten or stung by nonvenomous insect and other nonvenomous arthropods, initial encounter: Secondary | ICD-10-CM

## 2018-03-17 LAB — POCT URINALYSIS DIPSTICK
Appearance: NORMAL
Glucose, UA: NEGATIVE
Ketones, UA: NEGATIVE
Leukocytes, UA: NEGATIVE
Nitrite, UA: NEGATIVE
Protein, UA: 100
Spec Grav, UA: >=1.03 — AB (ref 1.010–1.025)
Urobilinogen, UA: 1 E.U./dL
pH, UA: 5.5 (ref 5.0–8.0)

## 2018-03-17 MED ORDER — HYDROXYZINE HCL 10 MG PO TABS: 10.0000 mg | ORAL_TABLET | Freq: Every day | ORAL | 0 refills | Status: DC

## 2018-03-17 MED ORDER — OXYCODONE-ACETAMINOPHEN 7.5-325 MG PO TABS: 1.0000 | ORAL_TABLET | Freq: Three times a day (TID) | ORAL | 0 refills | Status: DC | PRN

## 2018-03-17 MED ORDER — TAMSULOSIN HCL 0.4 MG PO CAPS: 0.4000 mg | ORAL_CAPSULE | Freq: Every day | ORAL | 5 refills | Status: DC

## 2018-03-17 MED ORDER — LISINOPRIL-HYDROCHLOROTHIAZIDE 20-12.5 MG PO TABS: 2.0000 | ORAL_TABLET | Freq: Every day | ORAL | 5 refills | Status: DC

## 2018-03-17 MED ORDER — OXYCODONE-ACETAMINOPHEN 7.5-325 MG PO TABS: 1.0000 | ORAL_TABLET | ORAL | 0 refills | Status: DC | PRN

## 2018-03-17 MED ORDER — METHOCARBAMOL 500 MG PO TABS: 500.0000 mg | ORAL_TABLET | Freq: Three times a day (TID) | ORAL | 1 refills | Status: DC | PRN

## 2018-03-17 NOTE — Progress Notes (Signed)
Establish care Concerns with pain management 10/10 chronic back pain Anxiety  Bed bug bites all over

## 2018-03-17 NOTE — Progress Notes (Addendum)
Subjective:  Patient ID: Douglas Glass, male    DOB: 01/13/1961  Age: 57 y.o. MRN: [id number]  CC: pain management   HPI Douglas Glass is a 57 y.o. male with a medical history of BPH, chronic back pain, Chlamydial infection, HLD, HTN, prostatitis,Tobacco use disorder, and lung nodule presents for management of his right back pain and right hip pain. Rated 10/10. Previously treated by Dr. Ronne BinningMckenzie with Percocet 10-325 mg. 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 XR of lumbar spine in 2014 negative. Endorses sudden giving away of the entire RLE which occurs occasionally. Uses a golf club as a cane. Request rx for cane. Does not endorse radiculopathy down lower extremities or saddle paresthesia but states that he has noted urinary dribbling and possible fecal incontinence. Says he sometimes feels a small amount of "boo boo" on himself.      Also complains of bed bug bites since two years ago. Says he has applied salt to his bed in an effort to kill bed bugs. Also applies salt to all his wounds caused by scratching bed bug lesions.    Outpatient Medications Prior to Visit  Medication Sig Dispense Refill  . lisinopril-hydrochlorothiazide (PRINZIDE,ZESTORETIC) 10-12.5 MG tablet Take 1 tablet by mouth daily.    . metaxalone (SKELAXIN) 800 MG tablet Take 1 tablet (800 mg total) by mouth 3 (three) times daily. 21 tablet 0  . oxyCODONE-acetaminophen (PERCOCET/ROXICET) 5-325 MG tablet Take 2 tablets by mouth every 4 (four) hours as needed for severe pain. 10 tablet 0  . Tamsulosin HCl (FLOMAX) 0.4 MG CAPS Take 1 capsule (0.4 mg total) by mouth daily. 30 capsule 1  . cephALEXin (KEFLEX) 500 MG capsule Take 1 capsule (500 mg total) by mouth 4 (four) times daily. (Patient not taking: Reported on 12/19/2017) 28 capsule 0  . diphenhydrAMINE (BENADRYL) 25 mg capsule Take 1-2 capsules (25-50 mg total) by mouth every 6 (six) hours as needed. (Patient not taking: Reported on  12/19/2017) 30 capsule 0  . nystatin (MYCOSTATIN/NYSTOP) powder Apply topically 3 (three) times daily. (Patient not taking: Reported on 12/19/2017) 15 g 0  . oxyCODONE-acetaminophen (PERCOCET) 10-325 MG tablet Take 1-2 tablets by mouth every 4 (four) hours as needed for pain.  0  . permethrin (ELIMITE) 5 % cream Apply to affected area once (Patient not taking: Reported on 12/19/2017) 60 g 0   No facility-administered medications prior to visit.      ROS Review of Systems  Constitutional: Negative for chills, fever and malaise/fatigue.  Eyes: Negative for blurred vision.  Respiratory: Negative for shortness of breath.   Cardiovascular: Negative for chest pain and palpitations.  Gastrointestinal: Negative for abdominal pain and nausea.  Genitourinary: Negative for dysuria and hematuria.       Urinary driblling  Musculoskeletal: Positive for back pain. Negative for joint pain and myalgias.  Skin: Positive for itching. Negative for rash.       Bug bites  Neurological: Negative for tingling and headaches.  Psychiatric/Behavioral: Negative for depression. The patient is not nervous/anxious.     Objective:  BP (!) 154/91 (BP Location: Left Arm, Patient Position: Sitting, Cuff Size: Large)   Pulse 71   Temp 97.8 F (36.6 C) (Oral)   Resp 16   Ht 5\' 7"  (1.702 m)   Wt 239 lb 12.8 oz (108.8 kg)   SpO2 95%   BMI 37.56 kg/m   BP/Weight 03/17/2018 12/19/2017 08/11/2017  Systolic BP 154 158 130  Diastolic BP  91 100 80  Wt. (Lbs) 239.8 - -  BMI 37.56 - -      Physical Exam  Constitutional: He is oriented to person, place, and time.  Well developed, overweight, NAD, dismissive, mildly slow rate of speech, seems to have sedative effects of opioid/drug use  HENT:  Head: Normocephalic and atraumatic.  Eyes: No scleral icterus.  Neck: Normal range of motion. Neck supple. No thyromegaly present.  Cardiovascular: Normal rate, regular rhythm and normal heart sounds.  Pulmonary/Chest: Effort  normal and breath sounds normal.  Musculoskeletal: He exhibits no edema.  Stands with difficulty from a seated position.   Neurological: He is alert and oriented to person, place, and time. No cranial nerve deficit.  Antalgic gait favoring right side.  Skin: Skin is warm and dry. No rash noted. No erythema. No pallor.  Psychiatric: He has a normal mood and affect. His behavior is normal. Thought content normal.  Vitals reviewed.    Assessment & Plan:      1. Skin lesions - Likely bed bug bites. - Begin hydroxyzine 10 mg - I have advised that he wash all items in his home that may have become contaminated with bed bugs.   2. Chronic right hip pain - No pattern of abuse on PMP Aware but longstanding use of opioid noted. - Decrease oxyCODONE-acetaminophen (PERCOCET) 7.5-325 MG tablet; Take 1 tablet by mouth every 8 (eight) hours as needed for severe pain.  Dispense: 90 tablet; Refill: 0 - Refill methocarbamol (ROBAXIN) 500 MG tablet; Take 1 tablet (500 mg total) by mouth 3 (three) times daily as needed for muscle spasms.  Dispense: 90 tablet; Refill: 1 - Cane single point - DG Arthro Hip Right; Future  3. Hypertension, unspecified type - Comprehensive metabolic panel - CBC with Differential - TSH - Begin lisinopril-hydrochlorothiazide (ZESTORETIC) 20-12.5 MG tablet; Take 2 tablets by mouth daily.  Dispense: 60 tablet; Refill: 5  4. Urinary dribbling - PSA - Urinalysis Dipstick with trace blood - Refill tamsulosin (FLOMAX) 0.4 MG CAPS capsule; Take 1 capsule (0.4 mg total) by mouth daily.  Dispense: 30 capsule; Refill: 5  5. Erectile dysfunction, unspecified erectile dysfunction type - PSA  6. High risk medication use - Drug Screen, Urine  * I have clearly stated that I will continue to lower the dose of patient's oxycodone at every visit. I plan to eventually start Gabapentin but I will need creatinine clearance first. He will also be referred to orthopedics and pain clinic  but he will need to apply for CAFA. I have strongly advised for patient to listen to the advise that was given. He seemed dismissive but said he will do what is asked of him to include urine drug tests.    Meds ordered this encounter  Medications  . DISCONTD: oxyCODONE-acetaminophen (PERCOCET) 7.5-325 MG tablet    Sig: Take 1 tablet by mouth every 4 (four) hours as needed for severe pain.    Dispense:  30 tablet    Refill:  0    Order Specific Question:   Supervising Provider    Answer:   Quentin Angst [id number]  . oxyCODONE-acetaminophen (PERCOCET) 7.5-325 MG tablet    Sig: Take 1 tablet by mouth every 8 (eight) hours as needed for severe pain.    Dispense:  90 tablet    Refill:  0    Order Specific Question:   Supervising Provider    Answer:   Quentin Angst [id number]  . lisinopril-hydrochlorothiazide (ZESTORETIC) 20-12.5 MG tablet  Sig: Take 2 tablets by mouth daily.    Dispense:  60 tablet    Refill:  5    Order Specific Question:   Supervising Provider    Answer:   Quentin Angst [id number]  . tamsulosin (FLOMAX) 0.4 MG CAPS capsule    Sig: Take 1 capsule (0.4 mg total) by mouth daily.    Dispense:  30 capsule    Refill:  5    Order Specific Question:   Supervising Provider    Answer:   Quentin Angst [id number]  . methocarbamol (ROBAXIN) 500 MG tablet    Sig: Take 1 tablet (500 mg total) by mouth 3 (three) times daily as needed for muscle spasms.    Dispense:  90 tablet    Refill:  1    Order Specific Question:   Supervising Provider    Answer:   Quentin Angst [id number]    Follow-up: Return in about 1 month (around 04/14/2018) for Chronic pain and HTN.   Loletta Specter PA

## 2018-03-17 NOTE — Patient Instructions (Addendum)
Bedbugs Bedbugs are tiny bugs that live in and around beds. During the day, they stay hidden. At night, they come out and bite. Where are bedbugs found? Bedbugs can be found anywhere. It does not matter if a place is clean or dirty. They are often found in:  Hotels.  Shelters.  Dorms.  Hospitals.  Nursing homes.  Places where there are many birds or bats.  What are bedbug bites like? A bedbug bite leaves a small red bump with a darker red dot in the middle. The bump may show up soon after a person is bitten or a day or more later. Bedbug bites usually do not hurt, but they may itch. Most people do not need treatment for bedbug bites. The bumps usually go away on their own in a few days. How do I check for bedbugs? Bedbugs are reddish-brown, oval, and flat. They are very small and they cannot fly. Look for bedbugs in these places:  On mattresses, bed frames, headboards, and box springs.  On drapes and curtains in bedrooms.  Under the carpet in bedrooms.  Behind electrical outlets.  Behind any wallpaper that is peeling.  Inside luggage.  Also look for black or red spots or stains on or near the bed. What should I do if I find bedbugs? When Traveling Check your clothes, suitcase, and belongings for bedbugs before you go back home. You may want to throw away anything that has bedbugs on it. At Home Your bedroom may need to be treated by a pest control expert. You may also need to throw away mattresses or luggage. To help keep bedbugs from coming back, you may want to:  Put a plastic cover over your mattress.  Wash your clothes and bedding in water that is hotter than 120F (48.9C). Dry them on a hot setting.  Vacuum often around the bed and in all of the cracks where the bugs might hide.  Check all used furniture, bedding, or clothes that you bring into your home.  Get rid of bird nests and bat roosts that are near your home.  In Your Bed Try wearing pajamas that  have long sleeves and pant legs. Bedbugs usually bite areas of the skin that are not covered. This information is not intended to replace advice given to you by your health care provider. Make sure you discuss any questions you have with your health care provider. Document Released: 03/11/2011 Document Revised: 05/01/2016 Document Reviewed: 11/20/2014 Elsevier Interactive Patient Education  2018 [employer]    Opioid Pain Medicine Information Opioids are powerful medicines that are used to treat moderate to severe pain. Opioids should be taken with the supervision of a trained health care provider. They should be taken for the shortest period of time as possible. This is because opioids can be addictive and the longer you take opioids, the greater your risk of addiction (opioid use disorder). What do opioids do? Opioids help to reduce or eliminate pain. When used for short periods of time, they can help you:  Sleep better.  Do better in physical or occupational therapy.  Feel better in the first few days after an injury.  Recover from surgery.  What is a pain treatment plan? A pain treatment plan is an agreement between you and your health care provider. Pain is unique to each person, and treatments vary depending on your condition. To manage your pain successfully, you and your health care provider need to understand each other and work together. To  help you do this:  Discuss the goals of your treatment, including how much pain you might expect to have and how you will manage the pain.  Review the risks and benefits of taking opioid medicines for your condition.  Remember that a good treatment plan uses more than one approach and minimizes the chance of side effects.  Be honest about the amount of medicines you take, and about any drug or alcohol use.  Get pain medicine prescriptions from only one care provider.  Keep all follow-up visits as told by your health care provider.  This is important.  What instructions should I follow while taking opioid pain medicine? While you are taking the medicine and for 8 hours after you stop taking the medicine, follow these instructions:  Do not drive.  Do not use machinery or power tools.  Do not sign legal documents.  Do not drink alcohol.  Do not take sleeping pills.  Do not supervise children by yourself.  Do not participate in activities that require climbing or being in high places.  Do not enter a body of water-such as a lake, river, ocean, spa, or swimming pool-unless an adult is nearby who can monitor and help you.  What kinds of side effects can opioids cause? Opioids can cause side effects, such as:  Constipation.  Nausea.  Vomiting.  Drowsiness.  Confusion.  Opioid use disorder.  Breathing difficulties (respiratory depression).  Using opioid pain medicines for longer than 3 days increases your risk of these side effects. Taking opioid pain medicine for a long period of time can affect your ability to do daily tasks. It also puts you at risk for:  Motor vehicle accidents.  Depression.  Suicide.  Heart attack.  Overdose, which can sometimes lead to death.  What are alternative ways to manage pain? Pain can be managed with many types of alternative treatments. Ask your health care provider to refer you to one or more specialists who can help you manage pain through:  Physical or occupational therapy.  Counseling (cognitive behavioral therapy).  Good nutrition.  Biofeedback.  Massage.  Meditation.  Non-opioid medicine.  Following a gentle exercise program.  How can I keep others safe while I am taking opioid pain medicine?  Keep pain medicine in a locked cabinet, or in a secure area where children cannot reach it.  Never share your pain medicine with anyone.  Do not save any leftover pills. If you have leftover medicine, you can: 1. Bring the medicine to a  prescription take-back program. This is usually offered by the county or Patent examiner. 2. Throw it out in the trash. To do this:  Mix the medicine with undesirable trash such as pet waste or food.  Put the mixture in a sealed container or plastic bag.  Throw it in the trash.  Destroy any personal information on the prescription bottle. How do I stop taking opioids if I have been taking them for a long time? If you have been taking opioid medicine for more than a few weeks, you may need to slowly decrease (taper) how much you take until you stop completely. Tapering your use of opioids can decrease your chances of experiencing withdrawal symptoms, such as:  Pain and cramping in the abdomen.  Nausea.  Sweating.  Sleepiness.  Restlessness.  Uncontrollable shaking (tremors).  Cravings for the medicine.  Do not attempt to taper your use of opioids on your own. Talk with your health care provider about how to do  this. Your health care provider may prescribe a step-down schedule based on how much medicine you are taking and how long you have been taking it. Where to find support: If you have been taking opioids for a long time, you may benefit from receiving support for quitting from a local support group or counselor. Ask your health care provider for a referral to these resources in your area. Where to find more information:  Centers for Disease Control and Prevention (CDC): DiscoHelp.si Get help right away if: Seek medical care right away if you are taking opioids and you (or people close to you) notice any of the following:  Difficulty breathing.  Breathing that is slower or more shallow than normal.  A very slow heartbeat (pulse).  Severe confusion.  Unconsciousness.  Sleepiness.  Slurred speech.  Nausea and vomiting.  Cold, clammy skin.  Blue lips or fingernails.  Limpness.  Abnormally small pupils.  If you think that you or  someone else may have taken too much of an opioid medicine, get medical help right away. Do not wait to see if the symptoms go away on their own.  If you ever feel like you may hurt yourself or others, or have thoughts about taking your own life, get help right away. You can go to your nearest emergency department or call:  Your local emergency services (911 in the [place]  The hotline of the [facility] (541)055-0927 in the [place]  A suicide crisis helpline, such as the National Suicide Prevention Lifeline at 918 323 5075. This is open 24 hours a day.  Summary  Opioid medicines can help you manage moderate to severe pain for a short period of time.  Discuss the goals of your treatment with your health care provider, including how much pain you might expect to have and how you will manage the pain.  A good treatment plan uses more than one approach. Pain can be managed with many types of alternative treatments.  If you think that you or someone else may have taken too much of an opioid, get medical help right away. This information is not intended to replace advice given to you by your health care provider. Make sure you discuss any questions you have with your health care provider. Document Released: 12/21/2015 Document Revised: 03/13/2017 Document Reviewed: 07/06/2015 Elsevier Interactive Patient Education  [employer].

## 2018-04-06 ENCOUNTER — Ambulatory Visit: Payer: Self-pay | Attending: Family Medicine

## 2018-04-13 ENCOUNTER — Ambulatory Visit (INDEPENDENT_AMBULATORY_CARE_PROVIDER_SITE_OTHER): Payer: Self-pay | Admitting: Physician Assistant

## 2018-04-13 ENCOUNTER — Encounter (INDEPENDENT_AMBULATORY_CARE_PROVIDER_SITE_OTHER): Payer: Self-pay | Admitting: Physician Assistant

## 2018-04-13 VITALS — BP 153/86 | HR 61 | Temp 97.9°F | Resp 18 | Ht 67.0 in | Wt 233.0 lb

## 2018-04-13 DIAGNOSIS — M25551 Pain in right hip: Secondary | ICD-10-CM

## 2018-04-13 DIAGNOSIS — M545 Low back pain: Secondary | ICD-10-CM

## 2018-04-13 DIAGNOSIS — R4689 Other symptoms and signs involving appearance and behavior: Secondary | ICD-10-CM

## 2018-04-13 DIAGNOSIS — Z029 Encounter for administrative examinations, unspecified: Secondary | ICD-10-CM

## 2018-04-13 DIAGNOSIS — G8929 Other chronic pain: Secondary | ICD-10-CM

## 2018-04-13 MED ORDER — NAPROXEN 500 MG PO TABS: 500.0000 mg | ORAL_TABLET | Freq: Two times a day (BID) | ORAL | 0 refills | Status: DC

## 2018-04-13 NOTE — Patient Instructions (Signed)

## 2018-04-13 NOTE — Progress Notes (Signed)
Subjective:  Patient ID: Douglas Glass, male    DOB: 05-12-1961  Age: 57 y.o. MRN: [id number]  CC: back pain  HPI  Douglas Glass is a 57 y.o. male with a medical history of BPH, chronic back pain, Chlamydial infection, HLD, HTN, prostatitis,Tobacco use disorder, and lung nodule presents for management of his right back pain and right hip pain. Rated 10/10. Previously treated by Dr. Ronne Binning with Percocet 10-325 mg. 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 XR of lumbar spine in 2014 negative. Endorses sudden giving away of the entire RLE which occurs occasionally. Seen here one month ago and asked to submit blood and urine to include a drug screen but patient has not complied. XR of hip ordered last month but has not gone to radiology yet. Does not endorse any other symptoms or complaints at this time.      Outpatient Medications Prior to Visit  Medication Sig Dispense Refill  . hydrOXYzine (ATARAX/VISTARIL) 10 MG tablet Take 1 tablet (10 mg total) by mouth at bedtime. 30 tablet 0  . lisinopril-hydrochlorothiazide (ZESTORETIC) 20-12.5 MG tablet Take 2 tablets by mouth daily. 60 tablet 5  . methocarbamol (ROBAXIN) 500 MG tablet Take 1 tablet (500 mg total) by mouth 3 (three) times daily as needed for muscle spasms. 90 tablet 1  . tamsulosin (FLOMAX) 0.4 MG CAPS capsule Take 1 capsule (0.4 mg total) by mouth daily. 30 capsule 5  . oxyCODONE-acetaminophen (PERCOCET) 7.5-325 MG tablet Take 1 tablet by mouth every 8 (eight) hours as needed for severe pain. (Patient not taking: Reported on 04/13/2018) 90 tablet 0   No facility-administered medications prior to visit.      ROS Review of Systems  Constitutional: Negative for chills, fever and malaise/fatigue.  Eyes: Negative for blurred vision.  Respiratory: Negative for shortness of breath.   Cardiovascular: Negative for chest pain and palpitations.  Gastrointestinal: Negative for abdominal pain and nausea.   Genitourinary: Negative for dysuria and hematuria.  Musculoskeletal: Positive for back pain and joint pain. Negative for myalgias.  Skin: Negative for rash.  Neurological: Negative for tingling and headaches.  Psychiatric/Behavioral: Negative for depression. The patient is not nervous/anxious.     Objective:  BP (!) 153/86 (BP Location: Left Arm, Patient Position: Sitting, Cuff Size: Large)   Pulse 61   Temp 97.9 F (36.6 C) (Oral)   Resp 18   Ht  (1.702 m)   Wt 233 lb (105.7 kg)   SpO2 94%   BMI 36.49 kg/m   BP/Weight 04/13/2018 03/17/2018 12/19/2017  Systolic BP 153 154 158  Diastolic BP 86 91 100  Wt. (Lbs) 233 239.8 -  BMI 36.49 37.56 -      Physical Exam  Constitutional: He is oriented to person, place, and time.  Well developed, well nourished, NAD, polite  HENT:  Head: Normocephalic and atraumatic.  Eyes: No scleral icterus.  Neck: Normal range of motion. Neck supple. No thyromegaly present.  Cardiovascular: Normal rate, regular rhythm and normal heart sounds.  Pulmonary/Chest: Effort normal and breath sounds normal.  Abdominal: Soft. Bowel sounds are normal. There is no tenderness.  Musculoskeletal: He exhibits no edema.  Pt states he is unable to sit for more than 5-10 minutes but he remained seated during the whole 25-30 minutes it took to fill out his disability form.   Neurological: He is alert and oriented to person, place, and time.  Skin: Skin is warm and dry. No rash noted. No  erythema. No pallor.  Psychiatric: He has a normal mood and affect. His behavior is normal. Thought content normal.  Pt seems somewhat altered as if he is high, mumbling, ranting, thoughts tangential at times.   Vitals reviewed.    Assessment & Plan:    1. Administrative encounter - 25-30 minutes spent with patient filling out disability form.  2. Chronic right-sided low back pain without sciatica - naproxen (NAPROSYN) 500 MG tablet; Take 1 tablet (500 mg total) by mouth  2 (two) times daily with a meal.  Dispense: 30 tablet; Refill: 0 - AMB referral to orthopedics  3. Chronic right hip pain - naproxen (NAPROSYN) 500 MG tablet; Take 1 tablet (500 mg total) by mouth 2 (two) times daily with a meal.  Dispense: 30 tablet; Refill: 0 - AMB referral to orthopedics  4. Non-compliant behavior - Did not do UDS, XR, and labs as directed previously. - Pt admitted to [facility] that he used cocaine two days ago.   Meds ordered this encounter  Medications  . naproxen (NAPROSYN) 500 MG tablet    Sig: Take 1 tablet (500 mg total) by mouth 2 (two) times daily with a meal.    Dispense:  30 tablet    Refill:  0    Order Specific Question:   Supervising Provider    Answer:   Quentin Angst [[id number]]    Follow-up: Return in about 1 month (around 05/11/2018).   Loletta Specter PA

## 2018-04-14 ENCOUNTER — Encounter (HOSPITAL_COMMUNITY): Payer: Self-pay | Admitting: Emergency Medicine

## 2018-04-14 ENCOUNTER — Ambulatory Visit (HOSPITAL_COMMUNITY)
Admission: EM | Admit: 2018-04-14 | Discharge: 2018-04-14 | Disposition: A | Discharge status: Home / Self Care | Payer: Self-pay | Attending: Family Medicine | Admitting: Family Medicine

## 2018-04-14 ENCOUNTER — Ambulatory Visit (INDEPENDENT_AMBULATORY_CARE_PROVIDER_SITE_OTHER): Payer: Self-pay | Admitting: Physician Assistant

## 2018-04-14 DIAGNOSIS — M25551 Pain in right hip: Secondary | ICD-10-CM

## 2018-04-14 DIAGNOSIS — F1920 Other psychoactive substance dependence, uncomplicated: Secondary | ICD-10-CM

## 2018-04-14 DIAGNOSIS — G894 Chronic pain syndrome: Secondary | ICD-10-CM

## 2018-04-14 DIAGNOSIS — G8929 Other chronic pain: Secondary | ICD-10-CM

## 2018-04-14 MED ORDER — OXYCODONE-ACETAMINOPHEN 7.5-325 MG PO TABS: 1.0000 | ORAL_TABLET | Freq: Three times a day (TID) | ORAL | 0 refills | Status: DC | PRN

## 2018-04-14 NOTE — ED Provider Notes (Signed)
[facility]   [id number] 04/14/18 Arrival Time: 1026  ASSESSMENT & PLAN:  1. Chronic pain syndrome   2. Dependency on pain medication (HCC)   3. Chronic right hip pain     Meds ordered this encounter  Medications  . oxyCODONE-acetaminophen (PERCOCET) 7.5-325 MG tablet    Sig: Take 1 tablet by mouth every 8 (eight) hours as needed for severe pain.    Dispense:  30 tablet    Refill:  0   Discussed that further refills for narcotic medications will need to be through his PCP, Loletta Specter, PA-C. He voices understanding.  See AVS for pain medication refill instructions.  Reviewed expectations re: course of current medical issues. Questions answered. Outlined signs and symptoms indicating need for more acute intervention. Patient verbalized understanding. After Visit Summary given.   SUBJECTIVE: History from: patient. Douglas Glass is a 57 y.o. male who presents requesting medication refill; oxycodone for chronic R hip pain. Has been on narcotics for many years. PCP's office closed. Has found a new PCP. No current concerns.  Current medical problems include: Past Medical History:  Diagnosis Date  . BPH (benign prostatic hyperplasia)   . Chronic back pain   . Hx of chlamydia infection   . Hypercholesteremia   . Hypertension   . Incidental lung nodule, > 3mm and < 8mm   . Prostatitis    Current Outpatient Medications:  .  hydrOXYzine (ATARAX/VISTARIL) 10 MG tablet, Take 1 tablet (10 mg total) by mouth at bedtime., Disp: 30 tablet, Rfl: 0 .  lisinopril-hydrochlorothiazide (ZESTORETIC) 20-12.5 MG tablet, Take 2 tablets by mouth daily., Disp: 60 tablet, Rfl: 5 .  methocarbamol (ROBAXIN) 500 MG tablet, Take 1 tablet (500 mg total) by mouth 3 (three) times daily as needed for muscle spasms., Disp: 90 tablet, Rfl: 1 .  naproxen (NAPROSYN) 500 MG tablet, Take 1 tablet (500 mg total) by mouth 2 (two) times daily with a meal., Disp: 30 tablet, Rfl: 0 .   oxyCODONE-acetaminophen (PERCOCET) 7.5-325 MG tablet, Take 1 tablet by mouth every 8 (eight) hours as needed for severe pain., Disp: 30 tablet, Rfl: 0 .  tamsulosin (FLOMAX) 0.4 MG CAPS capsule, Take 1 capsule (0.4 mg total) by mouth daily., Disp: 30 capsule, Rfl: 5  ROS: As per HPI.   OBJECTIVE:  Vitals:   04/14/18 1036  BP: (!) 186/115  Pulse: 81  Resp: 16  Temp: 98.8 F (37.1 C)  TempSrc: Oral  SpO2: 99%    General appearance: alert; no distress Eyes: PERRLA; EOMI; conjunctiva normal HENT: normocephalic; atraumatic Neck: supple  Lungs: clear to auscultation bilaterally Heart: regular rate and rhythm Back: no CVA tenderness Extremities: no cyanosis or edema; symmetrical with no gross deformities Skin: warm and dry Neurologic: normal gait; normal symmetric reflexes Psychological: alert and cooperative; normal mood and affect  No Known Allergies  Past Medical History:  Diagnosis Date  . BPH (benign prostatic hyperplasia)   . Chronic back pain   . Hx of chlamydia infection   . Hypercholesteremia   . Hypertension   . Incidental lung nodule, > 3mm and < 8mm   . Prostatitis    Social History   Socioeconomic History  . Marital status: Single    Spouse name: Not on file  . Number of children: Not on file  . Years of education: Not on file  . Highest education level: Not on file  Occupational History  . Not on file  Social Needs  . Financial resource strain:  Not on file  . Food insecurity:    Worry: Not on file    Inability: Not on file  . Transportation needs:    Medical: Not on file    Non-medical: Not on file  Tobacco Use  . Smoking status: Current Every Day Smoker    Packs/day: 1.00    Types: Cigarettes  . Smokeless tobacco: Never Used  Substance and Sexual Activity  . Alcohol use: No    Comment: former use  . Drug use: Yes    Types: Marijuana, Cocaine    Comment: percocets 2 days ago cocaine  . Sexual activity: Not on file  Lifestyle  . Physical  activity:    Days per week: Not on file    Minutes per session: Not on file  . Stress: Not on file  Relationships  . Social connections:    Talks on phone: Not on file    Gets together: Not on file    Attends religious service: Not on file    Active member of club or organization: Not on file    Attends meetings of clubs or organizations: Not on file    Relationship status: Not on file  . Intimate partner violence:    Fear of current or ex partner: Not on file    Emotionally abused: Not on file    Physically abused: Not on file    Forced sexual activity: Not on file  Other Topics Concern  . Not on file  Social History Narrative  . Not on file   Family History  Problem Relation Age of Onset  . Thyroid disease Mother   . Hypertension Mother   . Hypertension Other    Past Surgical History:  Procedure Laterality Date  . BUNIONECTOMY    . TESTICLE REMOVAL       Mardella Layman, MD 04/14/18 1120

## 2018-04-14 NOTE — ED Triage Notes (Signed)
Pt requesting refill on percocet 

## 2018-04-14 NOTE — Discharge Instructions (Addendum)
Urgent Care and Emergency Department providers appreciate that many patients coming to [place] are in severe pain and we wish to address their pain in the safest, most responsible manner. It is important to recognize however that the proper treatment of chronic pain differs from that of the pain of injuries and acute illnesses. Our goal is to provide quality, safe, personalized care and we thank you for giving [place] the opportunity to serve you.  The use of narcotics and related agents for chronic pain syndromes may lead to additional physical and psychological problems.  Nearly as many people die from prescription narcotics each year as die from car crashes. Additionally, this risk is increased if such prescriptions are obtained from a variety of sources. Therefore, only your primary care physician or a pain management specialist is able to safely treat such syndromes with narcotic medications long-term.    Documentation revealing such prescriptions have been sought from multiple sources may prohibit [place] from providing a refill or different narcotic medication. Your name may be checked first through the [facility] Controlled Substances Reporting System. This database is a record of controlled substance medication prescriptions that the patient has received. This has been established by [facility] in an effort to eliminate the dangerous, and often life threatening, practice of obtaining multiple prescriptions from different medical providers.   If you have a chronic pain syndrome (i.e. chronic headaches, recurrent back or neck pain, dental pain, abdominal or pelvis pain without a specific diagnosis, or neuropathic pain such as fibromyalgia) or recurrent visits for the same condition without an acute diagnosis, you may be treated with non-narcotics and other non-addictive medicines.  Allergic reactions or negative side effects that may be reported by a patient to such medications will not typically lead to the  use of a narcotic analgesic or other controlled substance as an alternative.   Patients managing chronic pain with a personal physician should have provisions in place for breakthrough pain.  If you are in crisis, you should call your physician.  If your physician directs you to the urgent care or the emergency department please have the doctor call and speak to our attending physician concerning your care.   When patients come to the Urgent Care or Emergency Department (ED) with acute medical conditions in which the provider feels appropriate to prescribe narcotic or sedating pain medication, he or she will prescribe these in very limited quantities.  The amount of these medications will last only until you can see your primary care physician in his/her office. Any patient who returns to the Urgent Care or ED seeking refills should expect only non-narcotic pain medications.   Prescriptions for narcotic or sedating medications that have been lost, stolen or expired will not be refilled.    Patients who have chronic pain may receive non-narcotic prescriptions until seen by their primary care physician. It is every patient?s personal responsibility to maintain active prescriptions with his or her primary care physician or specialist.

## 2018-05-04 MED FILL — LISINOPRIL-HCTZ 20-12.5 MG: 20-12.5 | 30 days supply | Qty: 60 | Fill #0

## 2018-05-04 MED FILL — ?TAMSULOSIN HCL 0.4 MG CAP: 0.4 | 30 days supply | Qty: 30 | Fill #0

## 2018-05-04 MED FILL — hydrOXYzine HCL 10 MG TABS: 10 | 30 days supply | Qty: 30 | Fill #0

## 2018-05-04 MED FILL — NAPROXEN 500 MG TABLET: 500 | 15 days supply | Qty: 30 | Fill #0

## 2018-05-04 MED FILL — METHOCARBAMOL 500 MG TABS: 500 | 30 days supply | Qty: 90 | Fill #0

## 2018-05-27 ENCOUNTER — Encounter (INDEPENDENT_AMBULATORY_CARE_PROVIDER_SITE_OTHER): Payer: Self-pay | Admitting: Physician Assistant

## 2018-05-27 ENCOUNTER — Ambulatory Visit (INDEPENDENT_AMBULATORY_CARE_PROVIDER_SITE_OTHER): Payer: Self-pay | Admitting: Physician Assistant

## 2018-05-27 ENCOUNTER — Other Ambulatory Visit: Payer: Self-pay

## 2018-05-27 VITALS — BP 155/91 | HR 68 | Temp 98.0°F | Ht 67.0 in | Wt 231.2 lb

## 2018-05-27 DIAGNOSIS — Z79899 Other long term (current) drug therapy: Secondary | ICD-10-CM

## 2018-05-27 DIAGNOSIS — I10 Essential (primary) hypertension: Secondary | ICD-10-CM

## 2018-05-27 DIAGNOSIS — G8929 Other chronic pain: Secondary | ICD-10-CM

## 2018-05-27 DIAGNOSIS — F1129 Opioid dependence with unspecified opioid-induced disorder: Secondary | ICD-10-CM

## 2018-05-27 DIAGNOSIS — M545 Low back pain, unspecified: Secondary | ICD-10-CM

## 2018-05-27 DIAGNOSIS — M25551 Pain in right hip: Secondary | ICD-10-CM

## 2018-05-27 DIAGNOSIS — N3943 Post-void dribbling: Secondary | ICD-10-CM

## 2018-05-27 MED ORDER — TAMSULOSIN HCL 0.4 MG PO CAPS: 0.4000 mg | ORAL_CAPSULE | Freq: Every day | ORAL | 5 refills | Status: DC

## 2018-05-27 MED ORDER — METHOCARBAMOL 500 MG PO TABS: 500.0000 mg | ORAL_TABLET | Freq: Every day | ORAL | 1 refills | Status: DC

## 2018-05-27 MED ORDER — NAPROXEN 500 MG PO TABS: 500.0000 mg | ORAL_TABLET | Freq: Two times a day (BID) | ORAL | 1 refills | Status: DC

## 2018-05-27 MED ORDER — AMLODIPINE BESYLATE 10 MG PO TABS: 10.0000 mg | ORAL_TABLET | Freq: Every day | ORAL | 5 refills | Status: DC

## 2018-05-27 MED ORDER — LISINOPRIL-HYDROCHLOROTHIAZIDE 20-12.5 MG PO TABS: 2.0000 | ORAL_TABLET | Freq: Every day | ORAL | 5 refills | Status: DC

## 2018-05-27 MED FILL — METHOCARBAMOL 500 MG TABS: 500 | 30 days supply | Qty: 30 | Fill #0

## 2018-05-27 MED FILL — AMLODIPINE BESYLATE 10 MG T: 10 | 30 days supply | Qty: 30 | Fill #0

## 2018-05-27 NOTE — Progress Notes (Signed)
Subjective:  Patient ID: Douglas Glass, male    DOB: 03/15/1961  Age: 57 y.o. MRN: [id number]  CC: HTN and chronic pain  HPI  Douglas Glass a 56 y.o.malewith a medical history of BPH, chronic back pain, Chlamydial infection, HLD, HTN, prostatitis,Tobacco use disorder, and lung nodule presents for management of his right back pain and right hip pain. Rated 10/10. Previously treated by Dr. Ronne BinningMckenzie with Percocet 10-325 mg. 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 XR of lumbar spine in 2014 negative.Endorses sudden giving away of the entire RLE which occurs occasionally. Pt was referred to orthopedics nearly six weeks ago but has not been seen because he his CAFA was still in process.  Also had X ray of the hip ordered two months ago but has not gotten done. Pt states he is in disagreement to having oxycodone down titrated as he is still very much in need of pain control. Says he feels like a rope is tied around his back which causes much pain during rotational movements. Can not golf anymore which was a passion of his.      Takes lisinopril-hctz at night. BP still elevated today. Does not endorse CP, palpitations, orthopnea, PND, LE edema, presyncope, syncope, abdominal pain, f/c/n/v, rash, or GI/GU sxs.    Outpatient Medications Prior to Visit  Medication Sig Dispense Refill  . hydrOXYzine (ATARAX/VISTARIL) 10 MG tablet Take 1 tablet (10 mg total) by mouth at bedtime. 30 tablet 0  . lisinopril-hydrochlorothiazide (ZESTORETIC) 20-12.5 MG tablet Take 2 tablets by mouth daily. 60 tablet 5  . methocarbamol (ROBAXIN) 500 MG tablet Take 1 tablet (500 mg total) by mouth 3 (three) times daily as needed for muscle spasms. 90 tablet 1  . naproxen (NAPROSYN) 500 MG tablet Take 1 tablet (500 mg total) by mouth 2 (two) times daily with a meal. 30 tablet 0  . oxyCODONE-acetaminophen (PERCOCET) 7.5-325 MG tablet Take 1 tablet by mouth every 8 (eight) hours as needed for  severe pain. 30 tablet 0  . tamsulosin (FLOMAX) 0.4 MG CAPS capsule Take 1 capsule (0.4 mg total) by mouth daily. (Patient not taking: Reported on 05/27/2018) 30 capsule 5   No facility-administered medications prior to visit.      ROS Review of Systems  Constitutional: Negative for chills, fever and malaise/fatigue.  Eyes: Negative for blurred vision.  Respiratory: Negative for shortness of breath.   Cardiovascular: Negative for chest pain and palpitations.  Gastrointestinal: Negative for abdominal pain and nausea.  Genitourinary: Negative for dysuria and hematuria.  Musculoskeletal: Positive for back pain and joint pain. Negative for myalgias.  Skin: Negative for rash.  Neurological: Negative for tingling and headaches.  Psychiatric/Behavioral: Negative for depression. The patient is not nervous/anxious.     Objective:  Wt 231 lb 3.2 oz (104.9 kg)   BMI 36.21 kg/m   Vitals:   05/27/18 0836  BP: (!) 155/91  Pulse: 68  Temp: 98 F (36.7 C)  TempSrc: Oral  SpO2: 100%  Weight: 231 lb 3.2 oz (104.9 kg)  Height: 5\' 7"  (1.702 m)      Physical Exam  Constitutional: He is oriented to person, place, and time.  Well developed, well nourished, NAD, polite  HENT:  Head: Normocephalic and atraumatic.  Eyes: No scleral icterus.  Neck: Normal range of motion. Neck supple. No thyromegaly present.  Cardiovascular: Normal rate, regular rhythm and normal heart sounds.  Pulmonary/Chest: Effort normal and breath sounds normal.  Abdominal: Soft. Bowel sounds  are normal. There is no tenderness.  Musculoskeletal: He exhibits no edema.  Back with approximately 80 degree forward flexion, 10 degrees extension, 10 degrees rotation bilaterally, normal lateral flexion.   Neurological: He is alert and oriented to person, place, and time.  Skin: Skin is warm and dry. No rash noted. No erythema. No pallor.  Psychiatric: Thought content normal.  Constricted affect, slowed speech, slowed  mannerisms, slowed blink and blink rate (suspected effects of opioid use.   Vitals reviewed.    Assessment & Plan:   1. Hypertension, unspecified type - amLODipine (NORVASC) 10 MG tablet; Take 1 tablet (10 mg total) by mouth daily.  Dispense: 30 tablet; Refill: 5 - lisinopril-hydrochlorothiazide (ZESTORETIC) 20-12.5 MG tablet; Take 2 tablets by mouth daily.  Dispense: 60 tablet; Refill: 5 - Comprehensive metabolic panel - CBC with Differential - TSH  2. Chronic right hip pain - AMB referral to orthopedics - Ambulatory referral to Pain Clinic - naproxen (NAPROSYN) 500 MG tablet; Take 1 tablet (500 mg total) by mouth 2 (two) times daily with a meal.  Dispense: 30 tablet; Refill: 1  3. Chronic right-sided low back pain without sciatica - AMB referral to orthopedics - Ambulatory referral to Pain Clinic - methocarbamol (ROBAXIN) 500 MG tablet; Take 1 tablet (500 mg total) by mouth at bedtime.  Dispense: 30 tablet; Refill: 1 - naproxen (NAPROSYN) 500 MG tablet; Take 1 tablet (500 mg total) by mouth 2 (two) times daily with a meal.  Dispense: 30 tablet; Refill: 1  4. Urinary dribbling - tamsulosin (FLOMAX) 0.4 MG CAPS capsule; Take 1 capsule (0.4 mg total) by mouth daily.  Dispense: 30 capsule; Refill: 5 - PSA  5. High risk medication use - Drug Screen, Urine  6. Opioid dependence with opioid induced disorder - pt given phone number list to several Methadone, suboxone clinics and advised to call them to eventually discontinue opioid use. Pt was in agreement and said he would call.     Meds ordered this encounter  Medications  . amLODipine (NORVASC) 10 MG tablet    Sig: Take 1 tablet (10 mg total) by mouth daily.    Dispense:  30 tablet    Refill:  5    Order Specific Question:   Supervising Provider    Answer:   Hoy Register [[id number]]  . tamsulosin (FLOMAX) 0.4 MG CAPS capsule    Sig: Take 1 capsule (0.4 mg total) by mouth daily.    Dispense:  30 capsule    Refill:  5     Order Specific Question:   Supervising Provider    Answer:   Hoy Register [[id number]]  . methocarbamol (ROBAXIN) 500 MG tablet    Sig: Take 1 tablet (500 mg total) by mouth at bedtime.    Dispense:  30 tablet    Refill:  1    Order Specific Question:   Supervising Provider    Answer:   Hoy Register [[id number]]  . naproxen (NAPROSYN) 500 MG tablet    Sig: Take 1 tablet (500 mg total) by mouth 2 (two) times daily with a meal.    Dispense:  30 tablet    Refill:  1    Order Specific Question:   Supervising Provider    Answer:   Hoy Register [[id number]]  . lisinopril-hydrochlorothiazide (ZESTORETIC) 20-12.5 MG tablet    Sig: Take 2 tablets by mouth daily.    Dispense:  60 tablet    Refill:  5    Order Specific Question:   Supervising  Provider    Answer:   Hoy Register [[id number]]    Follow-up: Return in about 6 weeks (around 07/08/2018) for HTN.   Loletta Specter PA

## 2018-05-27 NOTE — Patient Instructions (Addendum)
Community Resources  Advocacy/Legal Legal Aid [place]:  1-866-219-5262  /  336-272-0148  Family Justice Center:  336-641-7233  [facility] 24-hr Crisis line:  336-273-7273  Women's Resource Center, [place]:  336-275-6090  Court Watch (custody):  336-275-2346  [place] [facility]:   336-279-9299    Baby & Breastfeeding Car Seat Inspection @ Various [place] Fire Depts.- call 336-373-2177  [facility]  336-832-6860  [facility] 336-878-6712  WIC: 336-641-3663 ([place]);  336-641-7571 ([place])  La Leche League:  1-877-452-5321   Childcare [place] [facility]: 336-369-5097 ([place]) / 336-887-8224 ([place])  - Child Care Resources/ Referrals/ Scholarships  - Head Start/ Early Head Start (call or apply online)  [place] DHHS: [place] Pre-K :  1-800-859-0829 / 336-274-5437   Employment / Job Search [facility] [place] [facility]: 336-275-6090 / [address]  [employer] [employer] ([employer]): 336-373-5922 ([place]) / 336-882-4141 ([place])  [facility]/ Career Center: 336-275-9801 / 336-282-7307  [facility] Job & Career Center: 336-373-3764  DHHS Work First: 336-641-3447 ([place]) / 336-641-3447 ([place])  [facility] [place]:  336-676-5871   Financial Assistance [facility]:  336-553-2657  [employer]: 336-235-0368  [employer] (furniture):  336-370-4002  [place] [facility]: 336-373-4264  Low Income Energy Assistance  336-641-3000   Food Assistance DHHS- SNAP/ Food Stamps: 336-641-4588  WIC: [place]- 336-641-3663 ;  [place] 336-641-7571  Little Green Book- Free Meals  Little Blue Book- Free Food Pantries  During the summer, text "FOOD" to 877877   General Health / Clinics (Adults) Orange Card (for Adults) through [facility]: (336) 895-4900  [facility]:   336-832-8035  [facility]:   336-832-4444  Health Department:  336-641-3245  [facility]:  336-415-3877 / 336-641-2100  [facility] [place]:   336-373-0678  [facility]:   336-334-4822 x 50251   Housing [place] [facility] Coalition:   336-691-9521  [place] Housing Authority:  336-275-8501  Affordable Housing Managemnt:  336-273-0568   Immigrant/ Refugee Center for [place]ians ([employer]CG):  336-256-1065  [facility]:  336-379-0037  [facility] [employer] [facility]:  336-937-4701  [employer]:  336-617-0381  African Services Coalition:  336-574-2677   LGBTQ YouthSAFE  www.youthsafe[place].org  PFLAG  336-541-6754 / info@[place].org  The [employer] Project:  1-866-488-7386   Mental Health/ Substance Use [facility]  336-387-6161  [facility]:  336-832-9700 or 1-800-711-2635  [facility] of Care:  336-271-5888  [facility]:  336-294-1349  [facility]:  336-542-2884  [facility] (walk-ins)  336-676-6840 / [address]  [place]:  800-449-1287  Alcoholics Anonymous:  336-854-4278  Narcotics Anonymous:  800-365-1036  Quit Smoking Hotline:  800-QUIT-NOW (800-784-8669)   Parenting Children's Home Society:  800-632-1400  [place]: Education Center & Support Groups:  336-832-6682  YWCA: 336-273-3461  [employer]: Bringing Out the Best:  336-334-3120               Thriving at Three (Hispanic families): 336-256-1066  Healthy Start ([facility]):  336-387-6161 x2288  Parents as Teachers:  336-691-0024  [place] [facility]- Learning Together (Immigrants): 336-369-5001   Poison Control 800-222-1222  Sports & Recreation [employer] Open Doors Application: ymcanwnc.org/join/open-doors-financial-assistance/  City of [place] Recreation Centers: http://www.[place].gov/index.aspx?page=3615   Special Needs Family Support Network:  336-832-6507  Autism Society of [place]:   336-333-0197 x1402 or x1412 /  800-785-1035  TEACCH :  336-334-5773     ARC of [place]:  336-373-1076  Children's Developmental Service Agency (CDSA):  336-334-5601  CC4C (Care Coordination for Children):  336-641-7641   Transportation Medicaid Transportation: 336-641-4848 to apply  [place] Transit Authority: 336-335-6499 (reduced-fare bus ID to Medicaid/ Medicare/ Orange Card)  SCAT Paratransit services: Eligible riders only, call 336-333-6589 for application   Tutoring/Mentoring [facility] Child Development Institute: 336-230-2138  Big Brothers/ Big Sisters: 336-378-9100 ([place])  336-882-4167 ([place])  ACES through child's school: 336-370-2321  [employer] Achievers: contact your local Y  SHIELD Mentor Program: 336-337-2771    Hypertension Hypertension, commonly called high blood pressure, is when the force of blood pumping through the arteries is too strong. The arteries are the blood vessels that carry blood from the heart throughout the body. Hypertension forces the heart to work harder to pump blood and may cause arteries to become narrow or stiff. Having untreated or uncontrolled hypertension can cause heart attacks, strokes, kidney disease, and other problems. A blood pressure reading consists of a higher number over a lower number. Ideally, your blood pressure should be below 120/80. The first ("top") number is called the systolic pressure. It is a measure of the pressure in your arteries as your heart beats. The second ("bottom") number is called the diastolic pressure. It is a measure of the pressure in your arteries as the heart relaxes. What are the causes? The cause of this condition is not known. What increases the risk? Some risk factors for high blood pressure are under your control. Others are not. Factors you can change  Smoking.  Having type 2 diabetes mellitus, high cholesterol, or both.  Not getting enough exercise or physical activity.  Being overweight.  Having too much fat, sugar, calories, or salt (sodium) in your diet.  Drinking too  much alcohol. Factors that are difficult or impossible to change  Having chronic kidney disease.  Having a family history of high blood pressure.  Age. Risk increases with age.  Race. You may be at higher risk if you are African-American.  Gender. Men are at higher risk than women before age 45. After age 65, women are at higher risk than men.  Having obstructive sleep apnea.  Stress. What are the signs or symptoms? Extremely high blood pressure (hypertensive crisis) may cause:  Headache.  Anxiety.  Shortness of breath.  Nosebleed.  Nausea and vomiting.  Severe chest pain.  Jerky movements you cannot control (seizures).  How is this diagnosed? This condition is diagnosed by measuring your blood pressure while you are seated, with your arm resting on a surface. The cuff of the blood pressure monitor will be placed directly against the skin of your upper arm at the level of your heart. It should be measured at least twice using the same arm. Certain conditions can cause a difference in blood pressure between your right and left arms. Certain factors can cause blood pressure readings to be lower or higher than normal (elevated) for a short period of time:  When your blood pressure is higher when you are in a health care provider's office than when you are at home, this is called white coat hypertension. Most people with this condition do not need medicines.  When your blood pressure is higher at home than when you are in a health care provider's office, this is called masked hypertension. Most people with this condition may need medicines to control blood pressure.  If you have a high blood pressure reading during one visit or you have normal blood pressure with   other risk factors:  You may be asked to return on a different day to have your blood pressure checked again.  You may be asked to monitor your blood pressure at home for 1 week or longer.  If you are diagnosed  with hypertension, you may have other blood or imaging tests to help your health care provider understand your overall risk for other conditions. How is this treated? This condition is treated by making healthy lifestyle changes, such as eating healthy foods, exercising more, and reducing your alcohol intake. Your health care provider may prescribe medicine if lifestyle changes are not enough to get your blood pressure under control, and if:  Your systolic blood pressure is above 130.  Your diastolic blood pressure is above 80.  Your personal target blood pressure may vary depending on your medical conditions, your age, and other factors. Follow these instructions at home: Eating and drinking  Eat a diet that is high in fiber and potassium, and low in sodium, added sugar, and fat. An example eating plan is called the DASH (Dietary Approaches to Stop Hypertension) diet. To eat this way: ? Eat plenty of fresh fruits and vegetables. Try to fill half of your plate at each meal with fruits and vegetables. ? Eat whole grains, such as whole wheat pasta, brown rice, or whole grain bread. Fill about one quarter of your plate with whole grains. ? Eat or drink low-fat dairy products, such as skim milk or low-fat yogurt. ? Avoid fatty cuts of meat, processed or cured meats, and poultry with skin. Fill about one quarter of your plate with lean proteins, such as fish, chicken without skin, beans, eggs, and tofu. ? Avoid premade and processed foods. These tend to be higher in sodium, added sugar, and fat.  Reduce your daily sodium intake. Most people with hypertension should eat less than 1,500 mg of sodium a day.  Limit alcohol intake to no more than 1 drink a day for nonpregnant women and 2 drinks a day for men. One drink equals 12 oz of beer, 5 oz of wine, or 1 oz of hard liquor. Lifestyle  Work with your health care provider to maintain a healthy body weight or to lose weight. Ask what an ideal weight  is for you.  Get at least 30 minutes of exercise that causes your heart to beat faster (aerobic exercise) most days of the week. Activities may include walking, swimming, or biking.  Include exercise to strengthen your muscles (resistance exercise), such as pilates or lifting weights, as part of your weekly exercise routine. Try to do these types of exercises for 30 minutes at least 3 days a week.  Do not use any products that contain nicotine or tobacco, such as cigarettes and e-cigarettes. If you need help quitting, ask your health care provider.  Monitor your blood pressure at home as told by your health care provider.  Keep all follow-up visits as told by your health care provider. This is important. Medicines  Take over-the-counter and prescription medicines only as told by your health care provider. Follow directions carefully. Blood pressure medicines must be taken as prescribed.  Do not skip doses of blood pressure medicine. Doing this puts you at risk for problems and can make the medicine less effective.  Ask your health care provider about side effects or reactions to medicines that you should watch for. Contact a health care provider if:  You think you are having a reaction to a medicine you are   taking.  You have headaches that keep coming back (recurring).  You feel dizzy.  You have swelling in your ankles.  You have trouble with your vision. Get help right away if:  You develop a severe headache or confusion.  You have unusual weakness or numbness.  You feel faint.  You have severe pain in your chest or abdomen.  You vomit repeatedly.  You have trouble breathing. Summary  Hypertension is when the force of blood pumping through your arteries is too strong. If this condition is not controlled, it may put you at risk for serious complications.  Your personal target blood pressure may vary depending on your medical conditions, your age, and other factors. For  most people, a normal blood pressure is less than 120/80.  Hypertension is treated with lifestyle changes, medicines, or a combination of both. Lifestyle changes include weight loss, eating a healthy, low-sodium diet, exercising more, and limiting alcohol. This information is not intended to replace advice given to you by your health care provider. Make sure you discuss any questions you have with your health care provider. Document Released: 11/24/2005 Document Revised: 10/22/2016 Document Reviewed: 10/22/2016 Elsevier Interactive Patient Education  2018 Elsevier Inc.  

## 2018-05-28 ENCOUNTER — Ambulatory Visit (HOSPITAL_COMMUNITY)
Admission: EM | Admit: 2018-05-28 | Discharge: 2018-05-28 | Disposition: A | Discharge status: Home / Self Care | Payer: Self-pay | Attending: Family Medicine | Admitting: Family Medicine

## 2018-05-28 ENCOUNTER — Telehealth (INDEPENDENT_AMBULATORY_CARE_PROVIDER_SITE_OTHER): Payer: Self-pay

## 2018-05-28 DIAGNOSIS — Z760 Encounter for issue of repeat prescription: Secondary | ICD-10-CM

## 2018-05-28 LAB — COMPREHENSIVE METABOLIC PANEL
A/G RATIO: 1.2 (ref 1.2–2.2)
ALK PHOS: 94 IU/L (ref 39–117)
ALT: 15 IU/L (ref 0–44)
AST: 16 IU/L (ref 0–40)
Albumin: 4.3 g/dL (ref 3.5–5.5)
BILIRUBIN TOTAL: 0.6 mg/dL (ref 0.0–1.2)
BUN/Creatinine Ratio: 15 (ref 9–20)
BUN: 15 mg/dL (ref 6–24)
CO2: 23 mmol/L (ref 20–29)
Calcium: 9.8 mg/dL (ref 8.7–10.2)
Chloride: 103 mmol/L (ref 96–106)
Creatinine, Ser: 1.02 mg/dL (ref 0.76–1.27)
GFR calc Af Amer: 94 mL/min/{1.73_m2} (ref >59)
GFR calc non Af Amer: 81 mL/min/{1.73_m2} (ref >59)
GLOBULIN, TOTAL: 3.5 g/dL (ref 1.5–4.5)
Glucose: 87 mg/dL (ref 65–99)
POTASSIUM: 4.2 mmol/L (ref 3.5–5.2)
SODIUM: 139 mmol/L (ref 134–144)
Total Protein: 7.8 g/dL (ref 6.0–8.5)

## 2018-05-28 LAB — CBC WITH DIFFERENTIAL/PLATELET
Basophils Absolute: 0 10*3/uL (ref 0.0–0.2)
Basos: 0 %
EOS (ABSOLUTE): 0.4 10*3/uL (ref 0.0–0.4)
Eos: 5 %
Hematocrit: 42.7 % (ref 37.5–51.0)
Hemoglobin: 14.8 g/dL (ref 13.0–17.7)
IMMATURE GRANULOCYTES: 0 %
Immature Grans (Abs): 0 10*3/uL (ref 0.0–0.1)
LYMPHS ABS: 2.3 10*3/uL (ref 0.7–3.1)
Lymphs: 28 %
MCH: 26.9 pg (ref 26.6–33.0)
MCHC: 34.7 g/dL (ref 31.5–35.7)
MCV: 78 fL — AB (ref 79–97)
MONOS ABS: 0.9 10*3/uL (ref 0.1–0.9)
Monocytes: 11 %
NEUTROS PCT: 56 %
Neutrophils Absolute: 4.7 10*3/uL (ref 1.4–7.0)
PLATELETS: 283 10*3/uL (ref 150–450)
RBC: 5.5 x10E6/uL (ref 4.14–5.80)
RDW: 16.8 % — AB (ref 12.3–15.4)
WBC: 8.3 10*3/uL (ref 3.4–10.8)

## 2018-05-28 LAB — DRUG SCREEN, URINE
Amphetamines, Urine: NEGATIVE ng/mL
Barbiturate screen, urine: NEGATIVE ng/mL
Benzodiazepine Quant, Ur: NEGATIVE ng/mL
CANNABINOID QUANT UR: NEGATIVE ng/mL
COCAINE (METAB.): POSITIVE ng/mL — AB
OPIATE QUANT UR: NEGATIVE ng/mL
PCP Quant, Ur: NEGATIVE ng/mL

## 2018-05-28 LAB — PSA: Prostate Specific Ag, Serum: 0.8 ng/mL (ref 0.0–4.0)

## 2018-05-28 LAB — TSH: TSH: 1.79 u[IU]/mL (ref 0.450–4.500)

## 2018-05-28 MED FILL — ?TAMSULOSIN HCL 0.4 MG CAP: 0.4 | 30 days supply | Qty: 30 | Fill #1

## 2018-05-28 NOTE — ED Provider Notes (Signed)
[facility]    CSN: [id number] Arrival date & time: 05/28/18  1005     History   Chief Complaint Chief Complaint  Patient presents with  . Medication Refill    HPI Douglas Glass is a 57 y.o. male.   HPI  Douglas Glass is here requesting medicine refill.  He specifically requests oxycodone.  He states that he was given oxycodone for years by his prior primary care doctor for his chronic pain condition.  His prior physician left the practice.  He has a new primary care doctor.  He saw his new primary care doctor yesterday.  He states that his primary care doctor told him that he would give him oxycodone, however "his boss would not let him"..  I reviewed the chart from his PA, Douglas Glass.  It indicates that he does not want him on oxycodone.  I tried to challenge the patient with this information, he argued with me and specifically demanded I called Douglas Glass for clarification.  I did, I am correct, Douglas Glass has no intention of keeping him on oxycodone.  He has been here to the urgent care previously for oxycodone.  It is documented by the provider that he will be given one prescription, and one prescription only, and that this would not be refilled at the urgent care center. I reviewed the narcotic database.  Even when he was getting 90 pills a month from his prior primary care doctor, he was also supplementing this with medicine from other providers.  This is high risk behavior for narcotic overuse. I reviewed notes that states has been referred to orthopedist, has not gone.  Is been further x-rays, has not gone.  He is been referred to pain management.  He is been recommended to attend Suboxone and methadone clinics.  When I discussed this with the patient he states he cannot afford these other clinics.  Patient is advised that we will not provide him with narcotics through [facility] urgent care center  Past Medical History:  Diagnosis Date  . BPH (benign prostatic hyperplasia)    . Chronic back pain   . Hx of chlamydia infection   . Hypercholesteremia   . Hypertension   . Incidental lung nodule, > 3mm and < 8mm   . Prostatitis     Patient Active Problem List   Diagnosis Date Noted  . Chest pain 01/06/2012  . MENISCUS TEAR 04/18/2010  . MICROALBUMINURIA 09/25/2009  . 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 URINARY URGENCY 09/19/2009  . HYPERGLYCEMIA 09/19/2009  . FOOT PAIN 05/11/2009  . URETHRITIS 03/23/2009  . TOBACCO ABUSE 09/20/2008  . DYSURIA 09/20/2008  . CARPAL TUNNEL SYNDROME, BILATERAL 06/21/2008  . ALLERGIC RHINITIS 03/27/2008  . DENTAL PAIN 03/27/2008  . UNSPECIFIED DISORDER OF PENIS 12/31/2007  . ORCHIECTOMY, HX OF 10/22/2007  . DENTAL CARIES 09/24/2007  . BUNIONS, BILATERAL 09/24/2007    Past Surgical History:  Procedure Laterality Date  . BUNIONECTOMY    . TESTICLE REMOVAL         Home Medications    Prior to Admission medications   Medication Sig Start Date End Date Taking? Authorizing Provider  amLODipine (NORVASC) 10 MG tablet Take 1 tablet (10 mg total) by mouth daily. 05/27/18  Yes Loletta Specter, PA-C  hydrOXYzine (ATARAX/VISTARIL) 10 MG tablet Take 1 tablet (10 mg total) by mouth at bedtime. 03/17/18  Yes Loletta Specter, PA-C  lisinopril-hydrochlorothiazide (ZESTORETIC) 20-12.5 MG tablet  Take 2 tablets by mouth daily. 05/27/18  Yes Loletta SpecterGomez, Roger David, PA-C  methocarbamol (ROBAXIN) 500 MG tablet Take 1 tablet (500 mg total) by mouth at bedtime. 05/27/18  Yes Loletta SpecterGomez, Roger David, PA-C  naproxen (NAPROSYN) 500 MG tablet Take 1 tablet (500 mg total) by mouth 2 (two) times daily with a meal. 05/27/18  Yes Loletta SpecterGomez, Roger David, PA-C  tamsulosin [facility](FLOMAX) 0.4 MG CAPS capsule Take 1 capsule (0.4 mg total) by mouth daily. 05/27/18  Yes Loletta SpecterGomez, Roger David, PA-C    Family History Family History  Problem Relation Age of Onset  . Thyroid disease Mother   . Hypertension Mother   . Hypertension Other      Social History Social History   Tobacco Use  . Smoking status: Current Every Day Smoker    Packs/day: 1.00    Types: Cigarettes  . Smokeless tobacco: Never Used  Substance Use Topics  . Alcohol use: No    Comment: former use  . Drug use: Yes    Types: Marijuana, Cocaine    Comment: percocets 2 days ago cocaine     Allergies   Patient has no known allergies.   Review of Systems Review of Systems  Constitutional: Negative for chills and fever.  HENT: Negative for congestion, ear pain and sore throat.   Eyes: Negative for pain and visual disturbance.  Respiratory: Negative for cough and shortness of breath.   Cardiovascular: Negative for chest pain and palpitations.  Gastrointestinal: Negative for abdominal pain and vomiting.  Genitourinary: Negative for dysuria and hematuria.  Musculoskeletal: Positive for arthralgias, back pain, gait problem, neck pain and neck stiffness.       Chronic pain in neck back hip joints  Skin: Negative for color change and rash.  Neurological: Negative for seizures and syncope.  All other systems reviewed and are negative.    Physical Exam Triage Vital Signs ED Triage Vitals [05/28/18 1036]  Enc Vitals Group     BP 115/73     Pulse Rate 61     Resp 16     Temp 98.2 F (36.8 C)     Temp Source Oral     SpO2 96 %     Weight      Height      Head Circumference      Peak Flow      Pain Score 10     Pain Loc      Pain Edu?      Excl. in GC?    No data found.  Updated Vital Signs BP 115/73 (BP Location: Left Arm)   Pulse 61   Temp 98.2 F (36.8 C) (Oral)   Resp 16   SpO2 96%        Physical Exam  Constitutional: He appears well-developed and well-nourished. No distress.  HENT:  Head: Normocephalic and atraumatic.  Mouth/Throat: Oropharynx is clear and moist.  Eyes: Pupils are equal, round, and reactive to light. Conjunctivae are normal.  Neck: Normal range of motion.  Cardiovascular: Normal rate.   Pulmonary/Chest: Effort normal. No respiratory distress.  Abdominal: Soft. He exhibits no distension.  Musculoskeletal: Normal range of motion. He exhibits no edema.  Neurological: He is alert.  Skin: Skin is warm and dry.  Psychiatric:  Argumentative.  When I enter the room the second time he is talking on the phone and ignores me.     UC Treatments / Results  Labs (all labs ordered are listed, but only abnormal results are displayed)  Labs Reviewed - No data to display  EKG None  Radiology No results found.  Procedures Procedures (including critical care time)  Medications Ordered in UC Medications - No data to display  Initial Impression / Assessment and Plan / UC Course  I have reviewed the triage vital signs and the nursing notes.  Pertinent labs & imaging results that were available during my care of the patient were reviewed by me and considered in my medical decision making (see chart for details).    I discussed with the patient his primary care doctor's recommendations.  I agree there also my recommendations.  My first choice is for him to go to an addiction clinic to get off of his opioids.  He needs to see an orthopedic to treat his orthopedic problems instead of taking pain medicine to cover them up. Final Clinical Impressions(s) / UC Diagnoses   Final diagnoses:  Medication refill     Discharge Instructions     We are unable to provide you with narcotic refills at this office Follow up as Dr Lily Glass recommended: Orthopedic surgery Pain management Addiction/methadone/suboxone clinic   ED Prescriptions    None     Controlled Substance Prescriptions [place] Controlled Substance Registry consulted? No   Eustace Moore, MD 05/28/18 2129

## 2018-05-28 NOTE — ED Triage Notes (Signed)
Pt presents with complaints of needing refill on oxycodone and blood pressure medication.

## 2018-05-28 NOTE — Telephone Encounter (Signed)
Patient is aware of normal thyroid, PSA, kidney function and liver. Douglas Glass, CMA

## 2018-05-28 NOTE — Discharge Instructions (Addendum)
We are unable to provide you with narcotic refills at this office Follow up as Dr Lily KocherGomez recommended: Orthopedic surgery Pain management Addiction/methadone/suboxone clinic

## 2018-05-28 NOTE — Telephone Encounter (Signed)
-----   Message from Loletta Specteroger David Gomez, PA-C sent at 05/28/2018 11:17 AM EDT ----- Thyroid normal. PSA normal. Kidney function and liver results normal.

## 2018-06-04 ENCOUNTER — Ambulatory Visit (INDEPENDENT_AMBULATORY_CARE_PROVIDER_SITE_OTHER): Payer: Self-pay | Admitting: Orthopaedic Surgery

## 2018-06-04 ENCOUNTER — Telehealth: Payer: Self-pay

## 2018-06-04 NOTE — Telephone Encounter (Signed)
Patient is aware that UDS was positive for cocaine. Stated he knew it would be because he had just recently stopped using. Stated it will not be there anymore. Maryjean Mornempestt S Esmeralda Malay, CMA

## 2018-06-04 NOTE — Telephone Encounter (Signed)
-----   Message from Loletta Specteroger David Gomez, PA-C sent at 05/31/2018  1:03 PM EDT ----- Positive cocaine.

## 2018-06-17 ENCOUNTER — Ambulatory Visit (INDEPENDENT_AMBULATORY_CARE_PROVIDER_SITE_OTHER): Payer: Self-pay | Admitting: Orthopaedic Surgery

## 2018-06-17 ENCOUNTER — Ambulatory Visit (INDEPENDENT_AMBULATORY_CARE_PROVIDER_SITE_OTHER): Payer: Self-pay

## 2018-06-17 ENCOUNTER — Encounter (INDEPENDENT_AMBULATORY_CARE_PROVIDER_SITE_OTHER): Payer: Self-pay | Admitting: Orthopaedic Surgery

## 2018-06-17 DIAGNOSIS — G8929 Other chronic pain: Secondary | ICD-10-CM

## 2018-06-17 DIAGNOSIS — M1611 Unilateral primary osteoarthritis, right hip: Secondary | ICD-10-CM

## 2018-06-17 DIAGNOSIS — M545 Low back pain: Secondary | ICD-10-CM

## 2018-06-17 NOTE — Progress Notes (Signed)
Office Visit Note   Patient: Douglas Glass           Date of Birth: 1961-01-31           MRN: [id number] Visit Date: 06/17/2018              Requested by: Loletta Specter, PA-C [address] [place], [place] [address] PCP: Loletta Specter, PA-C   Assessment & Plan: Visit Diagnoses:  1. Chronic right-sided low back pain without sciatica   2. Primary osteoarthritis of right hip     Plan: Impression is lumbar spondylosis concerning for radiculopathy and right hip degenerative joint disease.  We provided the patient with a cane to help with his ambulation.  I do recommend MRI to rule out structural abnormalities and spinal stenosis.  Patient declined an intra-articular steroid injection.  Patient understands that we do not prescribe  opiate pain medicines for chronic pain management.  Follow-Up Instructions: Return if symptoms worsen or fail to improve.   Orders:  Orders Placed This Encounter  Procedures  . XR Lumbar Spine 2-3 Views  . XR HIP UNILAT W OR W/O PELVIS 2-3 VIEWS RIGHT  . MR Lumbar Spine w/o contrast   No orders of the defined types were placed in this encounter.     Procedures: No procedures performed   Clinical Data: No additional findings.   Subjective: Chief Complaint  Patient presents with  . Lower Back - Pain  . Right Hip - Pain    Patient is a 57 year old gentleman with chronic back and right hip pain for several years who takes oxycodone chronically who comes in with continued pain.  He states that he feels weakness in his right leg because of the fall.  He also has groin pain.  The pain radiates down his right leg.  Denies any injuries.   Review of Systems  Constitutional: Negative.   All other systems reviewed and are negative.    Objective: Vital Signs: There were no vitals taken for this visit.  Physical Exam  Constitutional: He is oriented to person, place, and time. He appears well-developed and well-nourished.  HENT:  Head:  Normocephalic and atraumatic.  Eyes: Pupils are equal, round, and reactive to light.  Neck: Neck supple.  Pulmonary/Chest: Effort normal.  Abdominal: Soft.  Musculoskeletal: Normal range of motion.  Neurological: He is alert and oriented to person, place, and time.  Skin: Skin is warm.  Psychiatric: He has a normal mood and affect. His behavior is normal. Judgment and thought content normal.  Nursing note and vitals reviewed.   Ortho Exam Right hip exam shows discomfort with rotation of the hip.  Positive FADIR.  Positive Stinchfield sign.  Positive straight leg.  No significant weakness of extremity. Specialty Comments:  No specialty comments available.  Imaging: Xr Hip Unilat W Or W/o Pelvis 2-3 Views Right  Result Date: 06/17/2018 Moderate degenerative joint disease of right hip with greater than 50% joint space narrowing.  Xr Lumbar Spine 2-3 Views  Result Date: 06/17/2018 Lumbar spondylosis.  No acute abnormalities    PMFS History: Patient Active Problem List   Diagnosis Date Noted  . Chest pain 01/06/2012  . MENISCUS TEAR 04/18/2010  . MICROALBUMINURIA 09/25/2009  . 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 URINARY URGENCY 09/19/2009  . HYPERGLYCEMIA 09/19/2009  . FOOT PAIN 05/11/2009  . URETHRITIS 03/23/2009  . TOBACCO ABUSE 09/20/2008  . DYSURIA 09/20/2008  . CARPAL TUNNEL SYNDROME, BILATERAL  06/21/2008  . ALLERGIC RHINITIS 03/27/2008  . DENTAL PAIN 03/27/2008  . UNSPECIFIED DISORDER OF PENIS 12/31/2007  . ORCHIECTOMY, HX OF 10/22/2007  . DENTAL CARIES 09/24/2007  . BUNIONS, BILATERAL 09/24/2007   Past Medical History:  Diagnosis Date  . BPH (benign prostatic hyperplasia)   . Chronic back pain   . Hx of chlamydia infection   . Hypercholesteremia   . Hypertension   . Incidental lung nodule, > 3mm and < 8mm   . Prostatitis     Family History  Problem Relation Age of Onset  . Thyroid disease Mother   . Hypertension Mother   .  Hypertension Other     Past Surgical History:  Procedure Laterality Date  . BUNIONECTOMY    . TESTICLE REMOVAL     Social History   Occupational History  . Not on file  Tobacco Use  . Smoking status: Current Every Day Smoker    Packs/day: 1.00    Types: Cigarettes  . Smokeless tobacco: Never Used  Substance and Sexual Activity  . Alcohol use: No    Comment: former use  . Drug use: Yes    Types: Marijuana, Cocaine    Comment: percocets 2 days ago cocaine  . Sexual activity: Not on file

## 2018-06-18 ENCOUNTER — Ambulatory Visit (HOSPITAL_COMMUNITY)
Admission: EM | Admit: 2018-06-18 | Discharge: 2018-06-18 | Disposition: A | Discharge status: Home / Self Care | Payer: No Typology Code available for payment source | Attending: Internal Medicine | Admitting: Internal Medicine

## 2018-06-18 ENCOUNTER — Telehealth (HOSPITAL_COMMUNITY): Payer: Self-pay | Admitting: Emergency Medicine

## 2018-06-18 ENCOUNTER — Encounter (HOSPITAL_COMMUNITY): Payer: Self-pay | Admitting: Emergency Medicine

## 2018-06-18 DIAGNOSIS — Z9079 Acquired absence of other genital organ(s): Secondary | ICD-10-CM | POA: Insufficient documentation

## 2018-06-18 DIAGNOSIS — Z8249 Family history of ischemic heart disease and other diseases of the circulatory system: Secondary | ICD-10-CM | POA: Insufficient documentation

## 2018-06-18 DIAGNOSIS — Z8349 Family history of other endocrine, nutritional and metabolic diseases: Secondary | ICD-10-CM | POA: Insufficient documentation

## 2018-06-18 DIAGNOSIS — L309 Dermatitis, unspecified: Secondary | ICD-10-CM | POA: Insufficient documentation

## 2018-06-18 DIAGNOSIS — N400 Enlarged prostate without lower urinary tract symptoms: Secondary | ICD-10-CM | POA: Insufficient documentation

## 2018-06-18 DIAGNOSIS — N451 Epididymitis: Secondary | ICD-10-CM

## 2018-06-18 DIAGNOSIS — I10 Essential (primary) hypertension: Secondary | ICD-10-CM | POA: Insufficient documentation

## 2018-06-18 DIAGNOSIS — E7800 Pure hypercholesterolemia, unspecified: Secondary | ICD-10-CM | POA: Insufficient documentation

## 2018-06-18 DIAGNOSIS — Z79899 Other long term (current) drug therapy: Secondary | ICD-10-CM | POA: Insufficient documentation

## 2018-06-18 DIAGNOSIS — L989 Disorder of the skin and subcutaneous tissue, unspecified: Secondary | ICD-10-CM

## 2018-06-18 DIAGNOSIS — N342 Other urethritis: Secondary | ICD-10-CM | POA: Insufficient documentation

## 2018-06-18 DIAGNOSIS — L0889 Other specified local infections of the skin and subcutaneous tissue: Secondary | ICD-10-CM

## 2018-06-18 DIAGNOSIS — F17210 Nicotine dependence, cigarettes, uncomplicated: Secondary | ICD-10-CM | POA: Insufficient documentation

## 2018-06-18 DIAGNOSIS — L308 Other specified dermatitis: Secondary | ICD-10-CM

## 2018-06-18 LAB — POCT URINALYSIS DIP (DEVICE)
Bilirubin Urine: NEGATIVE
GLUCOSE, UA: NEGATIVE mg/dL
Ketones, ur: NEGATIVE mg/dL
LEUKOCYTES UA: NEGATIVE
NITRITE: NEGATIVE
PROTEIN: 100 mg/dL — AB
SPECIFIC GRAVITY, URINE: 1.025 (ref 1.005–1.030)
UROBILINOGEN UA: 4 mg/dL — AB (ref 0.0–1.0)
pH: 6.5 (ref 5.0–8.0)

## 2018-06-18 LAB — RAPID HIV SCREEN (HIV 1/2 AB+AG)
HIV 1/2 ANTIBODIES: NONREACTIVE
HIV-1 P24 ANTIGEN - HIV24: NONREACTIVE

## 2018-06-18 MED ORDER — CEFTRIAXONE SODIUM 250 MG IJ SOLR
INTRAMUSCULAR | Status: AC
  Filled 2018-06-18: qty 250

## 2018-06-18 MED ORDER — KETOROLAC TROMETHAMINE 60 MG/2ML IM SOLN
60.0000 mg | Freq: Once | INTRAMUSCULAR | Status: AC
  Administered 2018-06-18: 60 mg via INTRAMUSCULAR

## 2018-06-18 MED ORDER — AZITHROMYCIN 250 MG PO TABS
1000.0000 mg | ORAL_TABLET | Freq: Once | ORAL | Status: AC
  Administered 2018-06-18: 1000 mg via ORAL

## 2018-06-18 MED ORDER — CEFTRIAXONE SODIUM 250 MG IJ SOLR
250.0000 mg | Freq: Once | INTRAMUSCULAR | Status: AC
  Administered 2018-06-18: 250 mg via INTRAMUSCULAR

## 2018-06-18 MED ORDER — SULFAMETHOXAZOLE-TRIMETHOPRIM 800-160 MG PO TABS: 1.0000 | ORAL_TABLET | Freq: Two times a day (BID) | ORAL | 0 refills | Status: AC

## 2018-06-18 MED ORDER — SULFAMETHOXAZOLE-TRIMETHOPRIM 800-160 MG PO TABS: 1.0000 | ORAL_TABLET | Freq: Two times a day (BID) | ORAL | 0 refills | Status: DC

## 2018-06-18 MED ORDER — KETOROLAC TROMETHAMINE 60 MG/2ML IM SOLN
INTRAMUSCULAR | Status: AC
  Filled 2018-06-18: qty 2

## 2018-06-18 MED ORDER — AZITHROMYCIN 250 MG PO TABS
ORAL_TABLET | ORAL | Status: AC
  Filled 2018-06-18: qty 4

## 2018-06-18 NOTE — ED Triage Notes (Signed)
Pt c/o burning with urination, pt also c/o testicle pain.

## 2018-06-18 NOTE — Discharge Instructions (Addendum)
Urinary and testicular discomfort suggest infection.  Urine and blood tests are pending.  The urgent care will contact you if further treatment is needed.  An injection of Rocephin and an oral dose of Zithromax (antibiotics) were given at the urgent care today.  Injection of ketorolac (anti inflammatory/pain reliever) was also given today.  Prescription for trimethoprim/sulfa (antibiotic) was sent to the pharmacy.  Anticipate gradual improvement in urinary and testicular discomfort over the next few days.  Recheck or follow-up with your primary care provider if there is a marked increase in discomfort or if you are not improving as expected.

## 2018-06-18 NOTE — ED Provider Notes (Signed)
[facility]    CSN: [id number] Arrival date & time: 06/18/18  1043     History   Chief Complaint Chief Complaint  Patient presents with  . Dysuria    HPI Douglas Glass is a 57 y.o. male.   He presents today with onset of pain in his right testicle during the night, and urinary frequency and discomfort.  (patient has surgical absence of left testicle).  No change in bowel habits, no abdominal or pelvic pain.  Reports some rash, which is clearing on his upper posterior thighs, he attributes this to sitting on an unfamiliar toilet seat.  Has chronic back pain but no unusual/new joint pains.  No nausea/vomiting.  No fever, no malaise.    HPI  Past Medical History:  Diagnosis Date  . BPH (benign prostatic hyperplasia)   . Chronic back pain   . Hx of chlamydia infection   . Hypercholesteremia   . Hypertension   . Incidental lung nodule, > 3mm and < 8mm   . Prostatitis     Patient Active Problem List   Diagnosis Date Noted  . Chest pain 01/06/2012  . MENISCUS TEAR 04/18/2010  . MICROALBUMINURIA 09/25/2009  . 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 URINARY URGENCY 09/19/2009  . HYPERGLYCEMIA 09/19/2009  . FOOT PAIN 05/11/2009  . URETHRITIS 03/23/2009  . TOBACCO ABUSE 09/20/2008  . DYSURIA 09/20/2008  . CARPAL TUNNEL SYNDROME, BILATERAL 06/21/2008  . ALLERGIC RHINITIS 03/27/2008  . DENTAL PAIN 03/27/2008  . UNSPECIFIED DISORDER OF PENIS 12/31/2007  . ORCHIECTOMY, HX OF 10/22/2007  . DENTAL CARIES 09/24/2007  . BUNIONS, BILATERAL 09/24/2007    Past Surgical History:  Procedure Laterality Date  . BUNIONECTOMY    . TESTICLE REMOVAL         Home Medications    Prior to Admission medications   Medication Sig Start Date End Date Taking? Authorizing Provider  amLODipine (NORVASC) 10 MG tablet Take 1 tablet (10 mg total) by mouth daily. 05/27/18   Loletta Specter, PA-C  hydrOXYzine (ATARAX/VISTARIL) 10 MG tablet Take 1 tablet (10  mg total) by mouth at bedtime. 03/17/18   Loletta Specter, PA-C  lisinopril-hydrochlorothiazide (ZESTORETIC) 20-12.5 MG tablet Take 2 tablets by mouth daily. 05/27/18   Loletta Specter, PA-C  methocarbamol (ROBAXIN) 500 MG tablet Take 1 tablet (500 mg total) by mouth at bedtime. 05/27/18   Loletta Specter, PA-C  naproxen (NAPROSYN) 500 MG tablet Take 1 tablet (500 mg total) by mouth 2 (two) times daily with a meal. 05/27/18   Loletta Specter, PA-C  sulfamethoxazole-trimethoprim (BACTRIM DS,SEPTRA DS) 800-160 MG tablet Take 1 tablet by mouth 2 (two) times daily for 10 days. 06/18/18 06/28/18  Isa Rankin, MD  tamsulosin (FLOMAX) 0.4 MG CAPS capsule Take 1 capsule (0.4 mg total) by mouth daily. 05/27/18   Loletta Specter, PA-C    Family History Family History  Problem Relation Age of Onset  . Thyroid disease Mother   . Hypertension Mother   . Hypertension Other     Social History Social History   Tobacco Use  . Smoking status: Current Every Day Smoker    Packs/day: 1.00    Types: Cigarettes  . Smokeless tobacco: Never Used  Substance Use Topics  . Alcohol use: No    Comment: former use  . Drug use: Yes    Types: Marijuana, Cocaine    Comment: percocets 2 days ago cocaine  Allergies   Patient has no known allergies.   Review of Systems Review of Systems  All other systems reviewed and are negative.    Physical Exam Triage Vital Signs ED Triage Vitals [06/18/18 1055]  Enc Vitals Group     BP (!) 165/101     Pulse Rate 75     Resp 18     Temp 99.8 F (37.7 C)     Temp src      SpO2 97 %     Weight      Height      Pain Score    Updated Vital Signs BP (!) 165/101   Pulse 75   Temp 99.8 F (37.7 C)   Resp 18   SpO2 97%  Physical Exam  Constitutional: He is oriented to person, place, and time. No distress.  Alert, nicely groomed Looks uncomfortable  HENT:  Head: Atraumatic.  Eyes:  Conjugate gaze, no eye redness/drainage  Neck: Neck  supple.  Cardiovascular: Normal rate.  Pulmonary/Chest: No respiratory distress.  Lungs clear, symmetric breath sounds  Abdominal: Soft. He exhibits no distension.  Genitourinary: Penis normal.  Genitourinary Comments: Uncircumcised, no rash, no discharge evident Right testicle is not swollen, no rash, moderate diffuse tenderness to palpation  Musculoskeletal: Normal range of motion.  Neurological: He is alert and oriented to person, place, and time.  Skin: Skin is warm and dry.  No cyanosis Mild scaling rash, perianal and extending onto the buttocks and upper posterior thighs Very superficial erosion, 1.5 cm, left lower abdomen, patient reports picking this, no surrounding erythema/swelling, clean  Nursing note and vitals reviewed.    UC Treatments / Results  Labs Results for orders placed or performed during the hospital encounter of 06/18/18  Urine culture  Result Value Ref Range   Specimen Description URINE, RANDOM    Special Requests NONE    Culture      NO GROWTH Performed at [facility], [address] [place], [place] [address]    Report Status 06/19/2018 FINAL   Rapid HIV screen (HIV 1/2 Ab+Ag)  Result Value Ref Range   HIV-1 P24 Antigen - HIV24 NON REACTIVE NON REACTIVE   HIV 1/2 Antibodies NON REACTIVE NON REACTIVE   Interpretation (HIV Ag Ab)      A non reactive test result means that HIV 1 or HIV 2 antibodies and HIV 1 p24 antigen were not detected in the specimen.  RPR  Result Value Ref Range   RPR Ser Ql Non Reactive Non Reactive  POCT urinalysis dip (device)  Result Value Ref Range   Glucose, UA NEGATIVE NEGATIVE mg/dL   Bilirubin Urine NEGATIVE NEGATIVE   Ketones, ur NEGATIVE NEGATIVE mg/dL   Specific Gravity, Urine 1.025 1.005 - 1.030   Hgb urine dipstick TRACE (A) NEGATIVE   pH 6.5 5.0 - 8.0   Protein, ur 100 (A) NEGATIVE mg/dL   Urobilinogen, UA 4.0 (H) 0.0 - 1.0 mg/dL   Nitrite NEGATIVE NEGATIVE   Leukocytes, UA NEGATIVE NEGATIVE     Procedures Procedures (including critical care time)  Medications Ordered in UC Medications  ketorolac (TORADOL) injection 60 mg (60 mg Intramuscular Given 06/18/18 1130)  cefTRIAXone (ROCEPHIN) injection 250 mg (250 mg Intramuscular Given 06/18/18 1130)  azithromycin (ZITHROMAX) tablet 1,000 mg (1,000 mg Oral Given 06/18/18 1130)    Final Clinical Impressions(s) / UC Diagnoses   Final diagnoses:  Urethritis  Dermatitis of anogenital region  Skin sore  Epididymitis, right  Discharge Instructions     Urinary and testicular discomfort suggest infection.  Urine and blood tests are pending.  The urgent care will contact you if further treatment is needed.  An injection of Rocephin and an oral dose of Zithromax (antibiotics) were given at the urgent care today.  Injection of ketorolac (anti inflammatory/pain reliever) was also given today.  Prescription for trimethoprim/sulfa (antibiotic) was sent to the pharmacy.  Anticipate gradual improvement in urinary and testicular discomfort over the next few days.  Recheck or follow-up with your primary care provider if there is a marked increase in discomfort or if you are not improving as expected.    ED Prescriptions    Medication Sig Dispense Auth. Provider   sulfamethoxazole-trimethoprim (BACTRIM DS,SEPTRA DS) 800-160 MG tablet Take 1 tablet by mouth 2 (two) times daily for 10 days. 20 tablet Isa Rankin, MD       Isa Rankin, MD 06/20/18 [phone]

## 2018-06-19 LAB — URINE CULTURE: Culture: NO GROWTH

## 2018-06-19 LAB — RPR: RPR Ser Ql: NONREACTIVE

## 2018-06-21 LAB — URINE CYTOLOGY ANCILLARY ONLY
CHLAMYDIA, DNA PROBE: NEGATIVE
Neisseria Gonorrhea: NEGATIVE
Trichomonas: NEGATIVE

## 2018-07-01 ENCOUNTER — Ambulatory Visit
Admission: RE | Admit: 2018-07-01 | Discharge: 2018-07-01 | Disposition: A | Discharge status: Home / Self Care | Payer: No Typology Code available for payment source | Source: Ambulatory Visit | Attending: Orthopaedic Surgery | Admitting: Orthopaedic Surgery

## 2018-07-01 DIAGNOSIS — M545 Low back pain: Principal | ICD-10-CM

## 2018-07-01 DIAGNOSIS — G8929 Other chronic pain: Secondary | ICD-10-CM

## 2018-07-01 MED FILL — METHOCARBAMOL 500 MG TABS: 500 | 30 days supply | Qty: 30 | Fill #1

## 2018-07-01 MED FILL — ?TAMSULOSIN HCL 0.4 MG CAP: 0.4 | 30 days supply | Qty: 30 | Fill #2

## 2018-07-01 MED FILL — AMLODIPINE BESYLATE 10 MG T: 10 | 30 days supply | Qty: 30 | Fill #1

## 2018-09-12 ENCOUNTER — Encounter (HOSPITAL_COMMUNITY): Payer: Self-pay | Admitting: Emergency Medicine

## 2018-09-12 ENCOUNTER — Emergency Department (HOSPITAL_COMMUNITY)
Admission: EM | Admit: 2018-09-12 | Discharge: 2018-09-12 | Disposition: A | Discharge status: Home / Self Care | Payer: Medicaid Other | Attending: Emergency Medicine | Admitting: Emergency Medicine

## 2018-09-12 DIAGNOSIS — Y999 Unspecified external cause status: Secondary | ICD-10-CM | POA: Diagnosis not present

## 2018-09-12 DIAGNOSIS — Y9389 Activity, other specified: Secondary | ICD-10-CM | POA: Diagnosis not present

## 2018-09-12 DIAGNOSIS — F17210 Nicotine dependence, cigarettes, uncomplicated: Secondary | ICD-10-CM | POA: Diagnosis not present

## 2018-09-12 DIAGNOSIS — Z79899 Other long term (current) drug therapy: Secondary | ICD-10-CM | POA: Insufficient documentation

## 2018-09-12 DIAGNOSIS — S61512A Laceration without foreign body of left wrist, initial encounter: Secondary | ICD-10-CM | POA: Diagnosis not present

## 2018-09-12 DIAGNOSIS — Z23 Encounter for immunization: Secondary | ICD-10-CM | POA: Insufficient documentation

## 2018-09-12 DIAGNOSIS — Y929 Unspecified place or not applicable: Secondary | ICD-10-CM | POA: Diagnosis not present

## 2018-09-12 DIAGNOSIS — W268XXA Contact with other sharp object(s), not elsewhere classified, initial encounter: Secondary | ICD-10-CM | POA: Insufficient documentation

## 2018-09-12 DIAGNOSIS — I10 Essential (primary) hypertension: Secondary | ICD-10-CM | POA: Insufficient documentation

## 2018-09-12 MED ORDER — TETANUS-DIPHTH-ACELL PERTUSSIS 5-2.5-18.5 LF-MCG/0.5 IM SUSP
0.5000 mL | Freq: Once | INTRAMUSCULAR | Status: AC
Start: 2018-09-12 — End: 2018-09-12
  Administered 2018-09-12: 0.5 mL via INTRAMUSCULAR
  Filled 2018-09-12: qty 0.5

## 2018-09-12 MED ORDER — LIDOCAINE-EPINEPHRINE (PF) 2 %-1:200000 IJ SOLN
20.0000 mL | Freq: Once | INTRAMUSCULAR | Status: AC
  Administered 2018-09-12: 20 mL
  Filled 2018-09-12: qty 20
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
## 2018-09-12 NOTE — ED Notes (Signed)
PA at bedside suturing pts laceration
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
## 2018-09-12 NOTE — Discharge Instructions (Addendum)

## 2018-10-15 ENCOUNTER — Ambulatory Visit: Payer: Self-pay | Attending: Physician Assistant

## 2018-10-15 ENCOUNTER — Telehealth: Payer: Self-pay | Admitting: Physician Assistant

## 2018-10-15 NOTE — Telephone Encounter (Signed)
Pt came on 10/15/18 to apply for thew CAFA and the [place], he was informed that we need the IRS non filling letter from 2018, he got upset since he claim that he went to the IRS and was told that he can used the 2017 non filling letter, I try to explain him that the IRS won't issued a letter for non filling after 05/22/18 for 2018 non filling, he did not get why he need that since he has a non filling for 2017 and still did not work, I told him that is part of the requirement we need that everyone has to apply by it. I apologues to him but I told him if he want me to sent the application with the 2017 non filling the application will be rejected and can not re apply for 6 months and all the bill bill be on him, I just try to avoid that, so I asking again to get the 2018 non filling letter

## 2018-10-29 ENCOUNTER — Ambulatory Visit: Payer: Self-pay | Attending: Family Medicine

## 2018-10-29 ENCOUNTER — Telehealth: Payer: Self-pay

## 2018-10-29 ENCOUNTER — Telehealth: Payer: Self-pay | Admitting: Physician Assistant

## 2018-10-29 MED FILL — LISINOPRIL-HCTZ 20-12.5 MG: 20-12.5 | 30 days supply | Qty: 60 | Fill #1

## 2018-10-29 MED FILL — METHOCARBAMOL 500 MG TABS: 500 | 30 days supply | Qty: 90 | Fill #1

## 2018-10-29 MED FILL — TAMSULOSIN HCL 0.4 MG CAP: 0.4 | 30 days supply | Qty: 30 | Fill #3

## 2018-10-29 MED FILL — NAPROXEN 500 MG TABLET: 500 | 15 days supply | Qty: 30 | Fill #0

## 2018-10-29 MED FILL — AMLODIPINE BESYLATE 10 MG T: 10 | 30 days supply | Qty: 30 | Fill #2

## 2018-10-29 NOTE — Telephone Encounter (Signed)
Met with the patient when he was in the clinic today at the request of Wynonia Hazard, [facility] Financial Counselor.  The patient  expressed frustrations with trying to obtain an [employer] for which Clifton James has explained the application process to him. He was informed that he can call [facility] if he has questions that Clifton James is not able to answer   He also spoke about his pain and the need for pain medication until he has surgery. He said that the specialist he was referred to was of no help.   He was requesting to see a provider at [facility] and was told that he has the right to choose the provider that he wants. It was also explained to him that there is a community care clinic at [facility]. Then he said that he just wants to be referred to a provider who will just give him his pain medication.  This CM explained that we can't do that.   He then requested a copy of his medical records and completed a release of information at the front desk.   He also inquired if he could pick up his medications which have refills.  This CM spoke to Georgiann Mohs, [place] and was informed that he can use his 1 free fill and the medications could be picked up today in about 1-1.5 hours and there would be no charge for the patient.  This was explained to the patient and he said that he would come back to the clinic to pick them up.

## 2018-10-29 NOTE — Telephone Encounter (Signed)
Pt does not want to the the pcp anymore ant to go to the [facility] he was inform that need to get the OC card with them, we won't be abl to issue the card, since that he need to see the pcp here first if he want to apply for the CAFA or OC

## 2018-10-29 NOTE — Telephone Encounter (Signed)
Noted  

## 2018-10-29 NOTE — Telephone Encounter (Signed)
Pt came back to the office since he change his mind, first [facility] and the OC and CAFA, but she does not want to go see the pcp, he is questioning why he need to see him when he does not helping, I inform him that is the rules for applying for what you need, the I ask him about what he has and he said that he has Food stamp, I told him that we are going to need a letter from them saying how much he get, he told me to my face that he won't give us the letter to put don't that he does not have food stamp, I told him that im sorry I can not qualified him for the program since he lie to me and I won't be able to help him, plus everyone has to apply to the rules and regulation we have and provide the documentation they need to bring to qualify him and not to omit any information or lie.[name]..[name]

## 2018-11-26 ENCOUNTER — Ambulatory Visit: Payer: Self-pay | Attending: Physician Assistant

## 2018-12-03 MED FILL — TAMSULOSIN HCL 0.4 MG CAP: 0.4 | 30 days supply | Qty: 30 | Fill #4

## 2018-12-21 ENCOUNTER — Ambulatory Visit (INDEPENDENT_AMBULATORY_CARE_PROVIDER_SITE_OTHER): Payer: Self-pay | Admitting: Family Medicine

## 2019-01-24 ENCOUNTER — Other Ambulatory Visit (INDEPENDENT_AMBULATORY_CARE_PROVIDER_SITE_OTHER): Payer: Self-pay | Admitting: Primary Care

## 2019-01-24 ENCOUNTER — Encounter (INDEPENDENT_AMBULATORY_CARE_PROVIDER_SITE_OTHER): Payer: Self-pay | Admitting: Primary Care

## 2019-01-24 ENCOUNTER — Other Ambulatory Visit: Payer: Self-pay

## 2019-01-24 ENCOUNTER — Ambulatory Visit (INDEPENDENT_AMBULATORY_CARE_PROVIDER_SITE_OTHER): Payer: Self-pay | Admitting: Primary Care

## 2019-01-24 VITALS — BP 153/91 | HR 79 | Temp 97.8°F | Ht 67.0 in | Wt 253.8 lb

## 2019-01-24 DIAGNOSIS — I10 Essential (primary) hypertension: Secondary | ICD-10-CM | POA: Insufficient documentation

## 2019-01-24 DIAGNOSIS — N3943 Post-void dribbling: Secondary | ICD-10-CM

## 2019-01-24 DIAGNOSIS — Z1211 Encounter for screening for malignant neoplasm of colon: Secondary | ICD-10-CM | POA: Insufficient documentation

## 2019-01-24 DIAGNOSIS — F17200 Nicotine dependence, unspecified, uncomplicated: Secondary | ICD-10-CM

## 2019-01-24 DIAGNOSIS — R350 Frequency of micturition: Secondary | ICD-10-CM

## 2019-01-24 DIAGNOSIS — Z0000 Encounter for general adult medical examination without abnormal findings: Secondary | ICD-10-CM

## 2019-01-24 DIAGNOSIS — N401 Enlarged prostate with lower urinary tract symptoms: Secondary | ICD-10-CM

## 2019-01-24 DIAGNOSIS — G894 Chronic pain syndrome: Secondary | ICD-10-CM

## 2019-01-24 MED ORDER — TAMSULOSIN HCL 0.4 MG PO CAPS: 0.4000 mg | ORAL_CAPSULE | Freq: Every day | ORAL | 5 refills | Status: AC

## 2019-01-24 MED ORDER — LISINOPRIL-HYDROCHLOROTHIAZIDE 20-12.5 MG PO TABS: 2.0000 | ORAL_TABLET | Freq: Every day | ORAL | 5 refills | Status: AC

## 2019-01-24 MED ORDER — AMLODIPINE BESYLATE 10 MG PO TABS: 10.0000 mg | ORAL_TABLET | Freq: Every day | ORAL | 5 refills | Status: AC

## 2019-01-24 NOTE — Patient Instructions (Signed)

## 2019-01-24 NOTE — Progress Notes (Signed)
Established Patient Office Visit  Subjective:  Patient ID: Douglas Glass, male    DOB: 03/26/1961  Age: 58 y.o. MRN: [id number]  CC:  Chief Complaint  Patient presents with  . frequent falls    when not taking pain medicine    HPI Effrey Scarola has medical history of BPH, chronic back pain, HLD,  and HTN. Tobacco use disorder. States recently fell and Percocets is the only thing that keeps him from falling. States buys them in the streets but knows this is not righ. C/o  back pain and right hip pain. Rated 10/10. Previously treated by DR. Mckenzie. States right leg  sudden giving away and subsequently fall.  Pain is interfering with ADL's and social life. Past Medical History:  Diagnosis Date  . BPH (benign prostatic hyperplasia)   . Chronic back pain   . Hx of chlamydia infection   . Hypercholesteremia   . Hypertension   . Incidental lung nodule, > 3mm and < 8mm   . Prostatitis     Past Surgical History:  Procedure Laterality Date  . BUNIONECTOMY    . TESTICLE REMOVAL      Family History  Problem Relation Age of Onset  . Thyroid disease Mother   . Hypertension Mother   . Hypertension Other     Social History   Socioeconomic History  . Marital status: Single    Spouse name: Not on file  . Number of children: Not on file  . Years of education: Not on file  . Highest education level: Not on file  Occupational History  . Not on file  Social Needs  . Financial resource strain: Not on file  . Food insecurity:    Worry: Not on file    Inability: Not on file  . Transportation needs:    Medical: Not on file    Non-medical: Not on file  Tobacco Use  . Smoking status: Current Every Day Smoker    Packs/day: 1.00    Types: Cigarettes  . Smokeless tobacco: Never Used  Substance and Sexual Activity  . Alcohol use: No    Comment: former use  . Drug use: Yes    Types: Marijuana, Cocaine    Comment: percocets 2 days ago cocaine  . Sexual activity: Not on file   Lifestyle  . Physical activity:    Days per week: Not on file    Minutes per session: Not on file  . Stress: Not on file  Relationships  . Social connections:    Talks on phone: Not on file    Gets together: Not on file    Attends religious service: Not on file    Active member of club or organization: Not on file    Attends meetings of clubs or organizations: Not on file    Relationship status: Not on file  . Intimate partner violence:    Fear of current or ex partner: Not on file    Emotionally abused: Not on file    Physically abused: Not on file    Forced sexual activity: Not on file  Other Topics Concern  . Not on file  Social History Narrative  . Not on file    Outpatient Medications Prior to Visit  Medication Sig Dispense Refill  . amLODipine (NORVASC) 10 MG tablet Take 1 tablet (10 mg total) by mouth daily. 30 tablet 5  . lisinopril-hydrochlorothiazide (ZESTORETIC) 20-12.5 MG tablet Take 2 tablets by mouth daily. 60 tablet 5  . methocarbamol (  ROBAXIN) 500 MG tablet Take 1 tablet (500 mg total) by mouth at bedtime. 30 tablet 1  . tamsulosin (FLOMAX) 0.4 MG CAPS capsule Take 1 capsule (0.4 mg total) by mouth daily. 30 capsule 5  . hydrOXYzine (ATARAX/VISTARIL) 10 MG tablet Take 1 tablet (10 mg total) by mouth at bedtime. (Patient not taking: Reported on 01/24/2019) 30 tablet 0  . naproxen (NAPROSYN) 500 MG tablet Take 1 tablet (500 mg total) by mouth 2 (two) times daily with a meal. 30 tablet 1   No facility-administered medications prior to visit.     No Known Allergies  ROS Review of Systems  Constitutional: Negative.   HENT: Negative.   Eyes: Negative.   Respiratory: Negative.   Gastrointestinal: Negative.   Endocrine: Negative.   Genitourinary: Negative.   Musculoskeletal: Positive for arthralgias and back pain.  Skin: Negative.   Allergic/Immunologic: Negative.   Neurological: Positive for numbness.  Hematological: Negative.   Psychiatric/Behavioral:  Negative.       Objective:    Physical Exam  Constitutional: He is oriented to person, place, and time. He appears well-nourished.  HENT:  Head: Normocephalic.  Eyes: Pupils are equal, round, and reactive to light. EOM are normal.  Neck: Normal range of motion. Neck supple.  Cardiovascular: Normal rate and regular rhythm.  Pulmonary/Chest: Effort normal and breath sounds normal.  Abdominal: Soft. Bowel sounds are normal. He exhibits distension.  Musculoskeletal: Normal range of motion.  Neurological: He is oriented to person, place, and time.  Skin: Skin is warm and dry.  Psychiatric: He has a normal mood and affect.    BP (!) 153/91 (BP Location: Left Arm, Patient Position: Sitting, Cuff Size: Large)   Pulse 79   Temp 97.8 F (36.6 C) (Oral)   Ht 5\' 7"  (1.702 m)   Wt 253 lb 12.8 oz (115.1 kg)   SpO2 92%   BMI 39.75 kg/m  Wt Readings from Last 3 Encounters:  01/24/19 253 lb 12.8 oz (115.1 kg)  09/12/18 234 lb (106.1 kg)  05/27/18 231 lb 3.2 oz (104.9 kg)     Health Maintenance Due  Topic Date Due  . Hepatitis C Screening  [birth year]  . COLONOSCOPY  05/09/2011  . INFLUENZA VACCINE  07/08/2018    There are no preventive care reminders to display for this patient.  Lab Results  Component Value Date   TSH 1.790 05/27/2018   Lab Results  Component Value Date   WBC 8.3 05/27/2018   HGB 14.8 05/27/2018   HCT 42.7 05/27/2018   MCV 78 (L) 05/27/2018   PLT 283 05/27/2018   Lab Results  Component Value Date   NA 139 05/27/2018   K 4.2 05/27/2018   CO2 23 05/27/2018   GLUCOSE 87 05/27/2018   BUN 15 05/27/2018   CREATININE 1.02 05/27/2018   BILITOT 0.6 05/27/2018   ALKPHOS 94 05/27/2018   AST 16 05/27/2018   ALT 15 05/27/2018   PROT 7.8 05/27/2018   ALBUMIN 4.3 05/27/2018   CALCIUM 9.8 05/27/2018   Lab Results  Component Value Date   CHOL 227 (H) 08/15/2011   Lab Results  Component Value Date   HDL 41 08/15/2011   Lab Results  Component Value Date    LDLCALC 150 (H) 08/15/2011   Lab Results  Component Value Date   TRIG 180 (H) 08/15/2011   Lab Results  Component Value Date   CHOLHDL 5.5 08/15/2011   Lab Results  Component Value Date   HGBA1C 5.7 (H) 01/06/2012  Aahil was seen today for frequent falls.  Diagnoses and all orders for this visit:  Hypertension, unspecified type Elevated states only took 1 of the 2 prescribed Bp meds the small one. Refill Bp meds    TOBACCO ABUSE Discuss the need for cessation not interested at this time  Chronic pain syndrome Requesting Percocet the only thing helps Denied  Benign prostatic hyperplasia with urinary frequency Refill his flomax      Grayce Sessions, NP

## 2019-01-25 ENCOUNTER — Emergency Department (HOSPITAL_COMMUNITY)
Admission: EM | Admit: 2019-01-25 | Discharge: 2019-01-25 | Disposition: A | Discharge status: Home / Self Care | Payer: Self-pay | Attending: Emergency Medicine | Admitting: Emergency Medicine

## 2019-01-25 ENCOUNTER — Other Ambulatory Visit: Payer: Self-pay

## 2019-01-25 DIAGNOSIS — M25512 Pain in left shoulder: Secondary | ICD-10-CM | POA: Insufficient documentation

## 2019-01-25 DIAGNOSIS — F17210 Nicotine dependence, cigarettes, uncomplicated: Secondary | ICD-10-CM | POA: Insufficient documentation

## 2019-01-25 DIAGNOSIS — I10 Essential (primary) hypertension: Secondary | ICD-10-CM | POA: Insufficient documentation

## 2019-01-25 DIAGNOSIS — G8929 Other chronic pain: Secondary | ICD-10-CM | POA: Insufficient documentation

## 2019-01-25 DIAGNOSIS — K0889 Other specified disorders of teeth and supporting structures: Secondary | ICD-10-CM | POA: Insufficient documentation

## 2019-01-25 DIAGNOSIS — Z79899 Other long term (current) drug therapy: Secondary | ICD-10-CM | POA: Insufficient documentation
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
## 2019-01-25 NOTE — ED Notes (Signed)
Patient left against medical advice.

## 2019-01-25 NOTE — ED Notes (Signed)
ED Provider at bedside. 
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
## 2019-01-26 ENCOUNTER — Ambulatory Visit (INDEPENDENT_AMBULATORY_CARE_PROVIDER_SITE_OTHER): Payer: Self-pay

## 2019-01-26 ENCOUNTER — Ambulatory Visit (HOSPITAL_COMMUNITY)
Admission: EM | Admit: 2019-01-26 | Discharge: 2019-01-26 | Disposition: A | Discharge status: Home / Self Care | Payer: Self-pay | Attending: Family Medicine | Admitting: Family Medicine

## 2019-01-26 ENCOUNTER — Encounter (HOSPITAL_COMMUNITY): Payer: Self-pay | Admitting: Emergency Medicine

## 2019-01-26 DIAGNOSIS — M25512 Pain in left shoulder: Secondary | ICD-10-CM

## 2019-01-26 DIAGNOSIS — S46002A Unspecified injury of muscle(s) and tendon(s) of the rotator cuff of left shoulder, initial encounter: Secondary | ICD-10-CM

## 2019-01-26 MED ORDER — PREDNISONE 5 MG PO TABS: 5.0000 mg | ORAL_TABLET | Freq: Every day | ORAL | 0 refills | Status: DC

## 2019-01-26 NOTE — Discharge Instructions (Signed)
You have a rotator cuff tear.
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
## 2019-01-27 MED FILL — LISINOPRIL-HCTZ 20-12.5 MG: 20-12.5 | 30 days supply | Qty: 60 | Fill #2

## 2019-01-27 MED FILL — TAMSULOSIN HCL 0.4 MG CAP: 0.4 | 30 days supply | Qty: 30 | Fill #5

## 2019-01-27 MED FILL — AMLODIPINE BESYLATE 10 MG T: 10 | 30 days supply | Qty: 30 | Fill #3

## 2019-03-18 ENCOUNTER — Encounter (HOSPITAL_COMMUNITY): Payer: Self-pay

## 2019-03-18 ENCOUNTER — Other Ambulatory Visit: Payer: Self-pay

## 2019-03-18 ENCOUNTER — Ambulatory Visit (HOSPITAL_COMMUNITY)
Admission: EM | Admit: 2019-03-18 | Discharge: 2019-03-18 | Disposition: A | Discharge status: Home / Self Care | Payer: Medicaid Other

## 2019-03-18 DIAGNOSIS — M25512 Pain in left shoulder: Secondary | ICD-10-CM

## 2019-03-18 DIAGNOSIS — G8929 Other chronic pain: Secondary | ICD-10-CM

## 2019-03-18 DIAGNOSIS — M545 Low back pain: Secondary | ICD-10-CM

## 2019-03-18 DIAGNOSIS — M25551 Pain in right hip: Secondary | ICD-10-CM

## 2019-03-18 NOTE — ED Triage Notes (Signed)
C/o left shoulder pain, right hip pain and back pain , denies injury, pain is chronic

## 2019-03-18 NOTE — ED Provider Notes (Signed)
[facility]    CSN: [id number] Arrival date & time: 03/18/19  [id number]     History   Chief Complaint Chief Complaint  Patient presents with  . Shoulder Pain    HPI Germain OsgoodDerick Coats is a 58 y.o. male.   Kenji Menard presents with complaints of chronic left shoulder, low back, and right hip pain. Left shoulder is more painful today, up to 10/10. Dislocated shoulder 12/2017 and has had pain since. Has been seen in the past due to chronic pain. Last visit 01/26/19. No new injury. Has been taking his muscle relaxer which hasn't helped. States he takes percocet typically for pain but he is out and hasn't been able to refill it. Denies any new numbness, tingling or weakness to extremities. Pain with raising of left shoulder. Hasn't taken his BP medication today, states he needs to pick up a refill. Hx of chronic pain, htn, bph.    ROS per HPI, negative if not otherwise mentioned.      Past Medical History:  Diagnosis Date  . BPH (benign prostatic hyperplasia)   . Chronic back pain   . Hx of chlamydia infection   . Hypercholesteremia   . Hypertension   . Incidental lung nodule, > 3mm and < 8mm   . Prostatitis     Patient Active Problem List   Diagnosis Date Noted  . Hypertension 01/24/2019  . Chronic pain syndrome 01/24/2019  . Benign prostatic hyperplasia with urinary frequency 01/24/2019  . Encounter for FIT (fecal immunochemical test) screening 01/24/2019  . Chest pain 01/06/2012  . MENISCUS TEAR 04/18/2010  . MICROALBUMINURIA 09/25/2009  . 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 URINARY URGENCY 09/19/2009  . HYPERGLYCEMIA 09/19/2009  . FOOT PAIN 05/11/2009  . URETHRITIS 03/23/2009  . TOBACCO ABUSE 09/20/2008  . DYSURIA 09/20/2008  . CARPAL TUNNEL SYNDROME, BILATERAL 06/21/2008  . ALLERGIC RHINITIS 03/27/2008  . DENTAL PAIN 03/27/2008  . UNSPECIFIED DISORDER OF PENIS 12/31/2007  . ORCHIECTOMY, HX OF 10/22/2007  . DENTAL CARIES 09/24/2007   . BUNIONS, BILATERAL 09/24/2007    Past Surgical History:  Procedure Laterality Date  . BUNIONECTOMY    . TESTICLE REMOVAL         Home Medications    Prior to Admission medications   Medication Sig Start Date End Date Taking? Authorizing Provider  amLODipine (NORVASC) 10 MG tablet Take 1 tablet (10 mg total) by mouth daily. 01/24/19  Yes Grayce SessionsEdwards, Michelle P, NP  lisinopril-hydrochlorothiazide (ZESTORETIC) 20-12.5 MG tablet Take 2 tablets by mouth daily. 01/24/19  Yes Grayce SessionsEdwards, Michelle P, NP  methocarbamol (ROBAXIN) 500 MG tablet Take 1 tablet (500 mg total) by mouth at bedtime. 05/27/18  Yes Loletta SpecterGomez, Roger David, PA-C  tamsulosin (FLOMAX) 0.4 MG CAPS capsule Take 1 capsule (0.4 mg total) by mouth daily. 01/24/19  Yes Grayce SessionsEdwards, Michelle P, NP  predniSONE (DELTASONE) 5 MG tablet Take 1 tablet (5 mg total) by mouth daily with breakfast. 01/26/19   Elvina SidleLauenstein, Kurt, MD    Family History Family History  Problem Relation Age of Onset  . Thyroid disease Mother   . Hypertension Mother   . Hypertension Other     Social History Social History   Tobacco Use  . Smoking status: Current Every Day Smoker    Packs/day: 1.00    Types: Cigarettes  . Smokeless tobacco: Never Used  Substance Use Topics  . Alcohol use: No    Comment: former use  . Drug use: Yes  Types: Marijuana, Cocaine    Comment: percocets 2 days ago cocaine     Allergies   Patient has no known allergies.   Review of Systems Review of Systems   Physical Exam Triage Vital Signs ED Triage Vitals  Enc Vitals Group     BP 03/18/19 0913 (!) 181/109     Pulse Rate 03/18/19 0913 76     Resp 03/18/19 0913 18     Temp 03/18/19 0913 (!) 97.4 F (36.3 C)     Temp src --      SpO2 03/18/19 0913 97 %     Weight --      Height --      Head Circumference --      Peak Flow --      Pain Score 03/18/19 0916 10     Pain Loc --      Pain Edu? --      Excl. in GC? --    No data found.  Updated Vital Signs BP (!)  181/109   Pulse 76   Temp (!) 97.4 F (36.3 C)   Resp 18   SpO2 97%   Visual Acuity Right Eye Distance:   Left Eye Distance:   Bilateral Distance:    Right Eye Near:   Left Eye Near:    Bilateral Near:     Physical Exam Constitutional:      Appearance: He is well-developed.  Cardiovascular:     Rate and Rhythm: Normal rate and regular rhythm.  Pulmonary:     Effort: Pulmonary effort is normal.     Breath sounds: Normal breath sounds.  Musculoskeletal:     Left shoulder: He exhibits decreased range of motion, tenderness, pain and decreased strength. He exhibits no bony tenderness, no swelling, no effusion, no crepitus, no deformity, no laceration, no spasm and normal pulse.     Comments: Pain with any raising of left arm, able to raise to shoulder level; pain with external rotation; tenderness to anterior soft tissue; no bony tenderness; strong radial pulse; gross sensation intact; ambulatory without difficulty, subjective pain to right hip and low back   Skin:    General: Skin is warm and dry.  Neurological:     Mental Status: He is alert and oriented to person, place, and time.      UC Treatments / Results  Labs (all labs ordered are listed, but only abnormal results are displayed) Labs Reviewed - No data to display  EKG None  Radiology No results found.  Procedures Procedures (including critical care time)  Medications Ordered in UC Medications - No data to display  Initial Impression / Assessment and Plan / UC Course  I have reviewed the triage vital signs and the nursing notes.  Pertinent labs & imaging results that were available during my care of the patient were reviewed by me and considered in my medical decision making (see chart for details).     Patient declines any offered treatments for chronic pain including medrol dose pack. Patient upset that stated unable to fill to manage percocet for chronic pain here in [facility]. Patient ambulatory out of [facility]  without AVS.  Final Clinical Impressions(s) / UC Diagnoses   Final diagnoses:  Chronic left shoulder pain  Chronic low back pain without sciatica, unspecified back pain laterality  Chronic pain of right hip   Discharge Instructions   None    ED Prescriptions    None     Controlled Substance Prescriptions [place]  Controlled Substance Registry consulted? Not Applicable   Georgetta Haber, NP 03/18/19 1014

## 2019-03-22 MED FILL — LISINOPRIL-HCTZ 20-12.5 MG: 20-12.5 | 30 days supply | Qty: 60 | Fill #0

## 2019-03-22 MED FILL — TAMSULOSIN HCL 0.4 MG CAP: 0.4 | 30 days supply | Qty: 30 | Fill #0

## 2019-04-01 ENCOUNTER — Ambulatory Visit (HOSPITAL_COMMUNITY)
Admission: EM | Admit: 2019-04-01 | Discharge: 2019-04-01 | Disposition: A | Discharge status: Home / Self Care | Payer: Medicaid Other | Attending: Emergency Medicine | Admitting: Emergency Medicine

## 2019-04-01 ENCOUNTER — Encounter (HOSPITAL_COMMUNITY): Payer: Self-pay | Admitting: Emergency Medicine

## 2019-04-01 ENCOUNTER — Other Ambulatory Visit: Payer: Self-pay

## 2019-04-01 DIAGNOSIS — B029 Zoster without complications: Secondary | ICD-10-CM

## 2019-04-01 MED ORDER — VALACYCLOVIR HCL 1 G PO TABS: 1000.0000 mg | ORAL_TABLET | Freq: Three times a day (TID) | ORAL | 0 refills | Status: AC

## 2019-04-01 NOTE — ED Triage Notes (Signed)
Pt presents for rash presenting on his left arm starting a few days ago.  Pt states its uncomfortable, but denies pain.  Denies itchiness/.

## 2019-04-01 NOTE — ED Provider Notes (Signed)
[facility]   [id number] 04/01/19 Arrival Time: 1213  CC: Left arm rash  SUBJECTIVE:  Douglas Glass is a 58 y.o. male hx significant for BPH, chronic back pain, takes percocet and has a pain contract, hypercholesteremia, HTN, lung nodule, and prostatitis, who presents with rash to left upper extremity x 2 days.  Denies precipitating event or trauma.  Denies sick contacts, or close contacts with similar rash.  Localizes the rash to left upper extremity.  Describes it as uncomfortable, but not painful or itchy.  States it is spreading from his left shoulder to his left lower extremity.  Has tried washing with bleach without relief.  Denies similar symptoms in the past.   Denies fever, chills, nausea, vomiting, swelling, discharge, SOB, chest pain, abdominal pain, changes in bowel or bladder function.    ROS: As per HPI.  Past Medical History:  Diagnosis Date  . BPH (benign prostatic hyperplasia)   . Chronic back pain   . Hx of chlamydia infection   . Hypercholesteremia   . Hypertension   . Incidental lung nodule, > 66mm and < 97mm   . Prostatitis    Past Surgical History:  Procedure Laterality Date  . BUNIONECTOMY    . TESTICLE REMOVAL     No Known Allergies No current facility-administered medications on file prior to encounter.    Current Outpatient Medications on File Prior to Encounter  Medication Sig Dispense Refill  . amLODipine (NORVASC) 10 MG tablet Take 1 tablet (10 mg total) by mouth daily. 30 tablet 5  . lisinopril-hydrochlorothiazide (ZESTORETIC) 20-12.5 MG tablet Take 2 tablets by mouth daily. 60 tablet 5  . tamsulosin (FLOMAX) 0.4 MG CAPS capsule Take 1 capsule (0.4 mg total) by mouth daily. 30 capsule 5   Social History   Socioeconomic History  . Marital status: Single    Spouse name: Not on file  . Number of children: Not on file  . Years of education: Not on file  . Highest education level: Not on file  Occupational History  . Not on file  Social  Needs  . Financial resource strain: Not on file  . Food insecurity:    Worry: Not on file    Inability: Not on file  . Transportation needs:    Medical: Not on file    Non-medical: Not on file  Tobacco Use  . Smoking status: Current Every Day Smoker    Packs/day: 1.00    Types: Cigarettes  . Smokeless tobacco: Never Used  Substance and Sexual Activity  . Alcohol use: No    Comment: former use  . Drug use: Yes    Types: Marijuana, Cocaine    Comment: percocets 2 days ago cocaine  . Sexual activity: Not on file  Lifestyle  . Physical activity:    Days per week: Not on file    Minutes per session: Not on file  . Stress: Not on file  Relationships  . Social connections:    Talks on phone: Not on file    Gets together: Not on file    Attends religious service: Not on file    Active member of club or organization: Not on file    Attends meetings of clubs or organizations: Not on file    Relationship status: Not on file  . Intimate partner violence:    Fear of current or ex partner: Not on file    Emotionally abused: Not on file    Physically abused: Not on file  Forced sexual activity: Not on file  Other Topics Concern  . Not on file  Social History Narrative  . Not on file   Family History  Problem Relation Age of Onset  . Thyroid disease Mother   . Hypertension Mother   . Hypertension Other     OBJECTIVE: Vitals:   04/01/19 1224  BP: 120/80  Pulse: 95  Resp: 18  Temp: 99.2 F (37.3 C)  TempSrc: Oral  SpO2: 99%    General appearance: alert; no distress Head: NCAT Lungs: clear to auscultation bilaterally Heart: regular rate and rhythm.  Radial pulse 2+ bilaterally Extremities: no edema Skin: warm and dry; Crops of erythematous papulovesicular rash over the C5/C6 dermatome on the left side; without crusting, no drainage, no bleeding; NTTP  Psychological: alert and cooperative; normal mood and affect  ASSESSMENT & PLAN:  1. Herpes zoster without  complication     Meds ordered this encounter  Medications  . valACYclovir (VALTREX) 1000 MG tablet    Sig: Take 1 tablet (1,000 mg total) by mouth 3 (three) times daily for 10 days.    Dispense:  30 tablet    Refill:  0    Order Specific Question:   Supervising Provider    Answer:   Eustace MooreELSON, YVONNE SUE [[id number]]    Rest and use ice/heat as needed for symptomatic relief Prescribed valacyclovir 1000mg  3x/day for 10 days Use OTC medications such as ibuprofen/ tylenol.  Follow up with PCP in 7-10 days if rash is still present Follow up with PCP if symptoms of burning, stinging, tingling or numbness occur after rash resolves, you may need additional treatment Return here or go to ER if you have any new or worsening symptoms (such as eye involvement, severe pain, or signs of secondary infection such as fever, chills, nausea, vomiting, discharge, redness or warmth over site of rash, etc...)  Reviewed expectations re: course of current medical issues. Questions answered. Outlined signs and symptoms indicating need for more acute intervention. Patient verbalized understanding. After Visit Summary given.   Rennis HardingWurst, Sonam Huelsmann, PA-C 04/01/19 1256

## 2019-04-01 NOTE — Discharge Instructions (Addendum)
Rest and use ice/heat as needed for symptomatic relief Prescribed valacyclovir 1000mg  3x/day for 10 days Use OTC medications such as ibuprofen/ tylenol.  Follow up with PCP in 7-10 days if rash is still present Follow up with PCP if symptoms of burning, stinging, tingling or numbness occur after rash resolves, you may need additional treatment Return here or go to ER if you have any new or worsening symptoms (such as eye involvement, severe pain, or signs of secondary infection such as fever, chills, nausea, vomiting, discharge, redness or warmth over site of rash, etc...)

## 2019-04-26 MED FILL — TAMSULOSIN HCL 0.4 MG CAP: 0.4 | 90 days supply | Qty: 90 | Fill #1

## 2019-04-26 MED FILL — AMLODIPINE BESYLATE 10 MG T: 10 | 60 days supply | Qty: 60 | Fill #4

## 2019-04-26 MED FILL — LISINOPRIL-HCTZ 20-12.5 MG: 20-12.5 | 90 days supply | Qty: 180 | Fill #1

## 2019-06-15 ENCOUNTER — Emergency Department (HOSPITAL_COMMUNITY): Payer: Medicaid Other

## 2019-06-15 ENCOUNTER — Emergency Department (HOSPITAL_COMMUNITY)
Admission: EM | Admit: 2019-06-15 | Discharge: 2019-06-16 | Disposition: A | Discharge status: Home / Self Care | Payer: Medicaid Other | Attending: Emergency Medicine | Admitting: Emergency Medicine

## 2019-06-15 ENCOUNTER — Encounter (HOSPITAL_COMMUNITY): Payer: Self-pay | Admitting: Emergency Medicine

## 2019-06-15 ENCOUNTER — Other Ambulatory Visit: Payer: Self-pay

## 2019-06-15 DIAGNOSIS — T401X1A Poisoning by heroin, accidental (unintentional), initial encounter: Secondary | ICD-10-CM

## 2019-06-15 DIAGNOSIS — Y999 Unspecified external cause status: Secondary | ICD-10-CM | POA: Insufficient documentation

## 2019-06-15 DIAGNOSIS — Y92410 Unspecified street and highway as the place of occurrence of the external cause: Secondary | ICD-10-CM | POA: Insufficient documentation

## 2019-06-15 DIAGNOSIS — Y9389 Activity, other specified: Secondary | ICD-10-CM | POA: Diagnosis not present

## 2019-06-15 DIAGNOSIS — F17200 Nicotine dependence, unspecified, uncomplicated: Secondary | ICD-10-CM | POA: Diagnosis not present

## 2019-06-15 DIAGNOSIS — S0990XA Unspecified injury of head, initial encounter: Secondary | ICD-10-CM | POA: Diagnosis present

## 2019-06-15 HISTORY — DX: Dorsalgia, unspecified: M54.9

## 2019-06-15 HISTORY — DX: Sleep apnea, unspecified: G47.30

## 2019-06-15 LAB — CBC WITH DIFFERENTIAL/PLATELET
Abs Immature Granulocytes: 0.05 10*3/uL (ref 0.00–0.07)
Basophils Absolute: 0 10*3/uL (ref 0.0–0.1)
Basophils Relative: 0 %
Eosinophils Absolute: 0.1 10*3/uL (ref 0.0–0.5)
Eosinophils Relative: 1 %
HCT: 39.4 % (ref 39.0–52.0)
Hemoglobin: 13.5 g/dL (ref 13.0–17.0)
Immature Granulocytes: 0 %
Lymphocytes Relative: 37 %
Lymphs Abs: 4.5 10*3/uL — ABNORMAL HIGH (ref 0.7–4.0)
MCH: 27 pg (ref 26.0–34.0)
MCHC: 34.3 g/dL (ref 30.0–36.0)
MCV: 78.8 fL — ABNORMAL LOW (ref 80.0–100.0)
Monocytes Absolute: 1.3 10*3/uL — ABNORMAL HIGH (ref 0.1–1.0)
Monocytes Relative: 11 %
Neutro Abs: 6.1 10*3/uL (ref 1.7–7.7)
Neutrophils Relative %: 51 %
Platelets: 270 10*3/uL (ref 150–400)
RBC: 5 MIL/uL (ref 4.22–5.81)
RDW: 14.9 % (ref 11.5–15.5)
WBC: 12 10*3/uL — ABNORMAL HIGH (ref 4.0–10.5)
nRBC: 0 % (ref 0.0–0.2)

## 2019-06-15 LAB — BASIC METABOLIC PANEL
Anion gap: 14 (ref 5–15)
BUN: 17 mg/dL (ref 6–20)
CO2: 21 mmol/L — ABNORMAL LOW (ref 22–32)
Calcium: 9.2 mg/dL (ref 8.9–10.3)
Chloride: 103 mmol/L (ref 98–111)
Creatinine, Ser: 1.38 mg/dL — ABNORMAL HIGH (ref 0.61–1.24)
GFR calc Af Amer: >60 mL/min (ref >60)
GFR calc non Af Amer: 56 mL/min — ABNORMAL LOW (ref >60)
Glucose, Bld: 207 mg/dL — ABNORMAL HIGH (ref 70–99)
Potassium: 3.1 mmol/L — ABNORMAL LOW (ref 3.5–5.1)
Sodium: 138 mmol/L (ref 135–145)

## 2019-06-15 LAB — PREPARE FRESH FROZEN PLASMA
Unit division: 0
Unit division: 0

## 2019-06-15 LAB — BPAM FFP
Blood Product Expiration Date: 202007302359
Blood Product Expiration Date: 202008012359
ISSUE DATE / TIME: 202007082226
ISSUE DATE / TIME: 202007082227
Unit Type and Rh: 600
Unit Type and Rh: 6200

## 2019-06-15 LAB — BPAM RBC
Blood Product Expiration Date: 202008092359
Blood Product Expiration Date: 202008092359
ISSUE DATE / TIME: 202007082226
ISSUE DATE / TIME: 202007082226
Unit Type and Rh: 5100
Unit Type and Rh: 5100

## 2019-06-15 MED ORDER — SODIUM CHLORIDE 0.9 % IV BOLUS (SEPSIS)
1000.0000 mL | Freq: Once | INTRAVENOUS | Status: AC
  Administered 2019-06-15: 1000 mL via INTRAVENOUS
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
## 2019-06-15 NOTE — Discharge Instructions (Addendum)
There is help if you need it.  Please do not use dirty needles, this could cause you a severe infection to your skin, heart or spinal cord.  This could kill you or leave you permanently disabled.    There was a recent study done at [facility] that showed that someone who comes in and requires Narcan has a 10 to 15% chance of mortality within the next year.  Please find help if you can.  [facility] Solution to the Opioid Problem (GCSTOP) Fixed; mobile; peer-based Chase Holleman [phone] [email or web address] Fixed site exchange at [facility], Wednesdays (2-5pm) and Thursdays (3-8pm). [place] [place],  [phone] Call or text to arrange mobile and peer exchange, Mondays (1-4pm) and Fridays (4-7pm). [place]
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
## 2019-06-16 ENCOUNTER — Encounter (HOSPITAL_COMMUNITY): Payer: Self-pay | Admitting: Emergency Medicine

## 2019-06-16 LAB — HEPATIC FUNCTION PANEL
ALT: 18 U/L (ref 0–44)
AST: 17 U/L (ref 15–41)
Albumin: 3.8 g/dL (ref 3.5–5.0)
Alkaline Phosphatase: 63 U/L (ref 38–126)
Bilirubin, Direct: 0.1 mg/dL (ref 0.0–0.2)
Indirect Bilirubin: 0.6 mg/dL (ref 0.3–0.9)
Total Bilirubin: 0.7 mg/dL (ref 0.3–1.2)
Total Protein: 8.3 g/dL — ABNORMAL HIGH (ref 6.5–8.1)

## 2019-06-16 LAB — ETHANOL: Alcohol, Ethyl (B): <10 mg/dL (ref <10)

## 2019-06-16 NOTE — ED Notes (Signed)
Pt refusing to wear cardiac monitoring. States that he wants to go home. EDP made aware.

## 2019-06-16 NOTE — Progress Notes (Signed)
Pt was awake and alert during our visit. He wanted me to contact his son, but contact information was not in the system and pt could not find his phone at the time. I checked back w/pt after he returned from ct scan and he had found his phone but said he was unable to reach him. During my follow-up visit pt was becoming impatient about leaving and accusatory of staff for care. I encouraged him to call the hospital tomorrow and express his concerned to patient relations. I learned during our visit pt did not have a ride and said he did not have anyone to call for transportation. Security escorted pt out of the unit. Marlise Eves, MDiv   06/16/19 0200  Clinical Encounter Type  Visited With Patient

## 2019-06-16 NOTE — ED Notes (Signed)
All appropriate discharge materials reviewed with patient at length. Time for questions provided. Pt denies any further questions at this time. Verbalizes understanding of all provided materials. Pt escorted out by security at time of discharge.
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
## 2019-11-03 ENCOUNTER — Emergency Department (HOSPITAL_COMMUNITY)
Admission: EM | Admit: 2019-11-03 | Discharge: 2019-11-03 | Disposition: A | Discharge status: Home / Self Care | Payer: Medicaid Other | Attending: Emergency Medicine | Admitting: Emergency Medicine

## 2019-11-03 ENCOUNTER — Encounter (HOSPITAL_COMMUNITY): Payer: Self-pay | Admitting: Emergency Medicine

## 2019-11-03 ENCOUNTER — Emergency Department (HOSPITAL_BASED_OUTPATIENT_CLINIC_OR_DEPARTMENT_OTHER)
Admit: 2019-11-03 | Discharge: 2019-11-03 | Disposition: A | Discharge status: Home / Self Care | Payer: Medicaid Other | Attending: Emergency Medicine | Admitting: Emergency Medicine

## 2019-11-03 ENCOUNTER — Other Ambulatory Visit: Payer: Self-pay

## 2019-11-03 ENCOUNTER — Emergency Department (HOSPITAL_COMMUNITY): Payer: Medicaid Other

## 2019-11-03 DIAGNOSIS — F419 Anxiety disorder, unspecified: Secondary | ICD-10-CM | POA: Insufficient documentation

## 2019-11-03 DIAGNOSIS — F17210 Nicotine dependence, cigarettes, uncomplicated: Secondary | ICD-10-CM | POA: Diagnosis not present

## 2019-11-03 DIAGNOSIS — R609 Edema, unspecified: Secondary | ICD-10-CM | POA: Diagnosis not present

## 2019-11-03 DIAGNOSIS — M79604 Pain in right leg: Secondary | ICD-10-CM | POA: Insufficient documentation

## 2019-11-03 DIAGNOSIS — M79672 Pain in left foot: Secondary | ICD-10-CM | POA: Diagnosis not present

## 2019-11-03 DIAGNOSIS — M79605 Pain in left leg: Secondary | ICD-10-CM | POA: Insufficient documentation

## 2019-11-03 DIAGNOSIS — R0600 Dyspnea, unspecified: Secondary | ICD-10-CM | POA: Insufficient documentation

## 2019-11-03 DIAGNOSIS — I10 Essential (primary) hypertension: Secondary | ICD-10-CM | POA: Insufficient documentation

## 2019-11-03 DIAGNOSIS — R0789 Other chest pain: Secondary | ICD-10-CM | POA: Diagnosis not present

## 2019-11-03 DIAGNOSIS — Z79899 Other long term (current) drug therapy: Secondary | ICD-10-CM | POA: Diagnosis not present

## 2019-11-03 LAB — D-DIMER, QUANTITATIVE: D-Dimer, Quant: 1.07 ug/mL-FEU — ABNORMAL HIGH (ref 0.00–0.50)

## 2019-11-03 LAB — BASIC METABOLIC PANEL
Anion gap: 7 (ref 5–15)
BUN: 14 mg/dL (ref 6–20)
CO2: 24 mmol/L (ref 22–32)
Calcium: 8.9 mg/dL (ref 8.9–10.3)
Chloride: 105 mmol/L (ref 98–111)
Creatinine, Ser: 1.04 mg/dL (ref 0.61–1.24)
GFR calc Af Amer: >60 mL/min (ref >60)
GFR calc non Af Amer: >60 mL/min (ref >60)
Glucose, Bld: 100 mg/dL — ABNORMAL HIGH (ref 70–99)
Potassium: 3.6 mmol/L (ref 3.5–5.1)
Sodium: 136 mmol/L (ref 135–145)

## 2019-11-03 LAB — CBC
HCT: 37.3 % — ABNORMAL LOW (ref 39.0–52.0)
Hemoglobin: 13 g/dL (ref 13.0–17.0)
MCH: 26.5 pg (ref 26.0–34.0)
MCHC: 34.9 g/dL (ref 30.0–36.0)
MCV: 76.1 fL — ABNORMAL LOW (ref 80.0–100.0)
Platelets: 313 10*3/uL (ref 150–400)
RBC: 4.9 MIL/uL (ref 4.22–5.81)
RDW: 15.9 % — ABNORMAL HIGH (ref 11.5–15.5)
WBC: 7.7 10*3/uL (ref 4.0–10.5)
nRBC: 0 % (ref 0.0–0.2)

## 2019-11-03 LAB — TROPONIN I (HIGH SENSITIVITY)
Troponin I (High Sensitivity): 7 ng/L (ref <18)
Troponin I (High Sensitivity): 7 ng/L (ref <18)

## 2019-11-03 LAB — BRAIN NATRIURETIC PEPTIDE: B Natriuretic Peptide: 14.8 pg/mL (ref 0.0–100.0)

## 2019-11-03 MED ORDER — IOHEXOL 350 MG/ML SOLN
100.0000 mL | Freq: Once | INTRAVENOUS | Status: AC | PRN
  Administered 2019-11-03: 75 mL via INTRAVENOUS

## 2019-11-03 NOTE — ED Notes (Signed)
Patient verbalizes understanding of discharge instructions. Opportunity for questioning and answers were provided. Pt discharged from ED. 

## 2019-11-03 NOTE — Discharge Instructions (Signed)
As discussed, your evaluation today has been largely reassuring.  But, it is important that you monitor your condition carefully, and do not hesitate to return to the ED if you develop new, or concerning changes in your condition. ? ?Otherwise, please follow-up with your physician for appropriate ongoing care. ? ?

## 2019-11-03 NOTE — ED Notes (Signed)
Pt refused to have blood drawn 

## 2019-11-03 NOTE — ED Notes (Signed)
Patient transported to CT 

## 2019-11-03 NOTE — ED Triage Notes (Addendum)
Patient presents with multiple complaints : reports central chest pain for 2 days with SOB , left ankle pain and pain at toes for 5 days and tingling at fingers for several weeks, denies SOB , no emesis or diaphoresis . Patient refused blood draw at triage .

## 2019-11-03 NOTE — ED Provider Notes (Signed)
[name] EMERGENCY DEPARTMENT Provider Note   CSN: [id number] Arrival date & time: 11/03/19  0000     History   Chief Complaint Chief Complaint  Patient presents with  . Chest Pain    HPI [facility] is a 58 y.o. male.     HPI Patient presents with concern of chest pain and bilateral leg discomfort. Patient has history of hypertension, uses drugs, including cocaine. He notes that 3 days ago, without clear precipitant he developed chest pain. Since that time he has had tightness in the chest. There are some associated dyspnea, though not clearly exertional, nor pleuritic. No fever, no cough. No abdominal pain, nausea, vomiting, fever, diarrhea. Patient notes a history of new pain in both legs, initially in the left, starting possibly 1 week ago, now on the right, both with distal discomfort, worse with walking, with no clear swelling bilaterally. Since onset few days ago, symptoms have progressed, with no clear alleviating factors. Past Medical History:  Diagnosis Date  . Back pain   . BPH (benign prostatic hyperplasia)   . Chronic back pain   . Hx of chlamydia infection   . Hypercholesteremia   . Hypertension   . Incidental lung nodule, > 81mm and < 44mm   . Prostatitis   . Sleep apnea     Patient Active Problem List   Diagnosis Date Noted  . Hypertension 01/24/2019  . Chronic pain syndrome 01/24/2019  . Benign prostatic hyperplasia with urinary frequency 01/24/2019  . Encounter for FIT (fecal immunochemical test) screening 01/24/2019  . Chest pain 01/06/2012  . MENISCUS TEAR 04/18/2010  . MICROALBUMINURIA 09/25/2009  . 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 URINARY URGENCY 09/19/2009  . HYPERGLYCEMIA 09/19/2009  . FOOT PAIN 05/11/2009  . URETHRITIS 03/23/2009  . TOBACCO ABUSE 09/20/2008  . DYSURIA 09/20/2008  . CARPAL TUNNEL SYNDROME, BILATERAL 06/21/2008  . ALLERGIC RHINITIS 03/27/2008  . DENTAL PAIN  03/27/2008  . UNSPECIFIED DISORDER OF PENIS 12/31/2007  . ORCHIECTOMY, HX OF 10/22/2007  . DENTAL CARIES 09/24/2007  . BUNIONS, BILATERAL 09/24/2007    Past Surgical History:  Procedure Laterality Date  . BUNIONECTOMY    . TESTICLE REMOVAL          Home Medications    Prior to Admission medications   Medication Sig Start Date End Date Taking? Authorizing Provider  amLODipine (NORVASC) 10 MG tablet Take 1 tablet (10 mg total) by mouth daily. 01/24/19   Grayce Sessions, NP  lisinopril-hydrochlorothiazide (ZESTORETIC) 20-12.5 MG tablet Take 2 tablets by mouth daily. 01/24/19   Grayce Sessions, NP  tamsulosin (FLOMAX) 0.4 MG CAPS capsule Take 1 capsule (0.4 mg total) by mouth daily. 01/24/19   Grayce Sessions, NP    Family History Family History  Problem Relation Age of Onset  . Thyroid disease Mother   . Hypertension Mother   . Hypertension Other     Social History Social History   Tobacco Use  . Smoking status: Current Every Day Smoker    Packs/day: 1.00    Types: Cigarettes  . Smokeless tobacco: Never Used  Substance Use Topics  . Alcohol use: Yes    Comment: former use  . Drug use: Yes    Types: Marijuana, Cocaine    Comment: percocets 2 days ago cocaine     Allergies   Patient has no known allergies.   Review of Systems Review of Systems  Constitutional:  Per HPI, otherwise negative  HENT:       Per HPI, otherwise negative  Respiratory:       Per HPI, otherwise negative  Cardiovascular:       Per HPI, otherwise negative  Gastrointestinal: Negative for vomiting.  Endocrine:       Negative aside from HPI  Genitourinary:       Neg aside from HPI   Musculoskeletal:       Per HPI, otherwise negative  Skin: Negative.   Neurological: Negative for syncope.     Physical Exam Updated Vital Signs BP 135/67   Pulse (!) 57   Temp 97.6 F (36.4 C) (Oral)   Resp 14   SpO2 95%   Physical Exam Vitals signs and nursing note reviewed.   Constitutional:      General: He is not in acute distress.    Appearance: He is well-developed.  HENT:     Head: Normocephalic and atraumatic.  Eyes:     Conjunctiva/sclera: Conjunctivae normal.  Cardiovascular:     Rate and Rhythm: Normal rate and regular rhythm.  Pulmonary:     Effort: Pulmonary effort is normal. No respiratory distress.     Breath sounds: No stridor.  Abdominal:     General: There is no distension.  Musculoskeletal:     Right lower leg: No edema.     Left lower leg: No edema.     Comments: Scars on bilateral medial feet consistent with bunionectomy, otherwise unremarkable  Skin:    General: Skin is warm and dry.  Neurological:     Mental Status: He is alert and oriented to person, place, and time.  Psychiatric:        Mood and Affect: Mood is anxious.      ED Treatments / Results  Labs (all labs ordered are listed, but only abnormal results are displayed) Labs Reviewed  BASIC METABOLIC PANEL - Abnormal; Notable for the following components:      Result Value   Glucose, Bld 100 (*)    All other components within normal limits  CBC - Abnormal; Notable for the following components:   HCT 37.3 (*)    MCV 76.1 (*)    RDW 15.9 (*)    All other components within normal limits  D-DIMER, QUANTITATIVE (NOT AT [facility]) - Abnormal; Notable for the following components:   D-Dimer, Quant 1.07 (*)    All other components within normal limits  BRAIN NATRIURETIC PEPTIDE  TROPONIN I (HIGH SENSITIVITY)  TROPONIN I (HIGH SENSITIVITY)    EKG EKG Interpretation  Date/Time:  Thursday November 03 2019 07:23:51 EST Ventricular Rate:  65 PR Interval:    QRS Duration: 90 QT Interval:  408 QTC Calculation: 425 R Axis:   -8 Text Interpretation: Sinus rhythm Baseline wander in lead(s) V1 Otherwise within normal limits Confirmed by Gerhard Munch [phone]) on 11/03/2019 7:35:50 AM   Radiology Dg Chest 2 View  Result Date: 11/03/2019 CLINICAL DATA:  Altered mental  status EXAM: CHEST - 2 VIEW COMPARISON:  Radiograph 09/01/2019, CT 01/28/2017 FINDINGS: Implantable loop recorder device projects over the left chest. Mild cardiomegaly is similar to prior. Coarse interstitial changes in the lungs are similar to comparisons as well. No consolidation, features of edema, pneumothorax, or effusion. No acute osseous or soft tissue abnormality. Surgical anchors noted in the right humeral head compatible with prior rotator cuff repair. Additional degenerative changes noted in the imaged spine and left shoulder as well. IMPRESSION: Mild stable cardiomegaly.  No  acute cardiopulmonary abnormality. Electronically Signed   By: Lovena Le M.D.   On: 11/03/2019 00:56   Ct Angio Chest Pe W/cm &/or Wo Cm  Result Date: 11/03/2019 CLINICAL DATA:  Chest pain 2 days which shortness-of-breath. Possible ACS/PE/AAS. EXAM: CT ANGIOGRAPHY CHEST WITH CONTRAST TECHNIQUE: Multidetector CT imaging of the chest was performed using the standard protocol during bolus administration of intravenous contrast. Multiplanar CT image reconstructions and MIPs were obtained to evaluate the vascular anatomy. CONTRAST:  17mL OMNIPAQUE IOHEXOL 350 MG/ML SOLN COMPARISON:  01/06/2012 FINDINGS: Cardiovascular: Heart is normal size. Mild calcified plaque over the left main, left anterior descending and lateral circumflex coronary arteries. Thoracic aorta is otherwise normal in caliber. Pulmonary arterial system is well opacified without evidence of emboli. Mediastinum/Nodes: No mediastinal or hilar adenopathy. Remaining mediastinal structures are unremarkable. Lungs/Pleura: Lungs are adequately inflated without focal airspace consolidation or effusion. 4 mm nodule over the right middle lobe stable since 2013. Subtle posterior left lower lobe atelectasis. Upper Abdomen: No acute findings. Musculoskeletal: No focal abnormality. Review of the MIP images confirms the above findings. IMPRESSION: 1. No acute cardiopulmonary  disease and no evidence of pulmonary embolism. 2.  Mild atherosclerotic coronary artery disease. Electronically Signed   By: Marin Olp M.D.   On: 11/03/2019 10:28   Vas [place] Lower Extremity Venous (dvt) (only Mc & Wl 7a-7p)  Result Date: 11/03/2019  Lower Venous Study Indications: Edema.  Risk Factors: None identified. Limitations: Body habitus and poor ultrasound/tissue interface. Comparison Study: No prior studies. Performing Technologist: Oliver Hum RVT  Examination Guidelines: A complete evaluation includes B-mode imaging, spectral Doppler, color Doppler, and power Doppler as needed of all accessible portions of each vessel. Bilateral testing is considered an integral part of a complete examination. Limited examinations for reoccurring indications may be performed as noted.  +---------+---------------+---------+-----------+----------+--------------+ RIGHT    CompressibilityPhasicitySpontaneityPropertiesThrombus Aging +---------+---------------+---------+-----------+----------+--------------+ CFV      Full           Yes      Yes                                 +---------+---------------+---------+-----------+----------+--------------+ SFJ      Full                                                        +---------+---------------+---------+-----------+----------+--------------+ FV Prox  Full                                                        +---------+---------------+---------+-----------+----------+--------------+ FV Mid   Full                                                        +---------+---------------+---------+-----------+----------+--------------+ FV DistalFull                                                        +---------+---------------+---------+-----------+----------+--------------+  PFV      Full                                                        +---------+---------------+---------+-----------+----------+--------------+ POP       Full           Yes      Yes                                 +---------+---------------+---------+-----------+----------+--------------+ PTV      Full                                                        +---------+---------------+---------+-----------+----------+--------------+ PERO     Full                                                        +---------+---------------+---------+-----------+----------+--------------+   +---------+---------------+---------+-----------+----------+--------------+ LEFT     CompressibilityPhasicitySpontaneityPropertiesThrombus Aging +---------+---------------+---------+-----------+----------+--------------+ CFV      Full           Yes      Yes                                 +---------+---------------+---------+-----------+----------+--------------+ SFJ      Full                                                        +---------+---------------+---------+-----------+----------+--------------+ FV Prox  Full                                                        +---------+---------------+---------+-----------+----------+--------------+ FV Mid   Full                                                        +---------+---------------+---------+-----------+----------+--------------+ FV DistalFull                                                        +---------+---------------+---------+-----------+----------+--------------+ PFV      Full                                                        +---------+---------------+---------+-----------+----------+--------------+  POP      Full           Yes      Yes                                 +---------+---------------+---------+-----------+----------+--------------+ PTV      Full                                                        +---------+---------------+---------+-----------+----------+--------------+ PERO     Full                                                         +---------+---------------+---------+-----------+----------+--------------+     Summary: Right: There is no evidence of deep vein thrombosis in the lower extremity. No cystic structure found in the popliteal fossa. Left: There is no evidence of deep vein thrombosis in the lower extremity. No cystic structure found in the popliteal fossa.  *See table(s) above for measurements and observations.    Preliminary     Procedures Procedures (including critical care time)  Medications Ordered in ED Medications  iohexol (OMNIPAQUE) 350 MG/ML injection 100 mL (75 mLs Intravenous Contrast Given 11/03/19 1014)     Initial Impression / Assessment and Plan / ED Course  I have reviewed the triage vital signs and the nursing notes.  Pertinent labs & imaging results that were available during my care of the patient were reviewed by me and considered in my medical decision making (see chart for details).    With elevated D-dimer, description of chest pain, bilateral lower extremity discomfort, the patient will have CT angiography performed.   9:17 AM Patient sleeping 11:22 AM CT angiography negative, vascular ultrasound negative, patient is sleeping. He awakens easily.  He states that he cannot move his left foot, that is his primary concern. However, patient is moving his left foot, and on repeat evaluation has palpable pulses appropriately, cap refill is unremarkable, color is unremarkable as well. He reiterates that his onset of foot pain was nontraumatic, and given preserved pulses, absence of DVT, no evidence for cellulitis, patient will follow-up as an outpatient for further evaluation of this. Patient's evaluation in general here is reassuring, with no CT, ultrasound evidence for DVT, PE, no EKG, lab evidence for ACS, no x-ray evidence for pneumonia.  Patient has remained hemodynamically unremarkable, sleeping 4 hours, with no evidence for distress. Patient appropriate for  discharge with outpatient clinic follow-up.  Final Clinical Impressions(s) / ED Diagnoses   Final diagnoses:  Atypical chest pain  Left foot pain     Gerhard MunchLockwood, Darryl Willner, MD 11/03/19 1123

## 2019-11-03 NOTE — ED Notes (Signed)
Pt given food and beverage per Dr. Lockwood 

## 2019-11-03 NOTE — Progress Notes (Signed)
Bilateral lower extremity venous duplex has been completed. Preliminary results can be found in CV Proc through chart review.  Results were given to Dr. Vanita Panda.  11/03/19 9:39 AM Carlos Levering RVT

## 2021-01-26 ENCOUNTER — Emergency Department (HOSPITAL_COMMUNITY)
Admission: EM | Admit: 2021-01-26 | Discharge: 2021-01-26 | Discharge status: Absent without leave | Payer: No Typology Code available for payment source

## 2021-01-26 NOTE — ED Notes (Signed)
No response for triage x2 

## 2021-01-26 NOTE — ED Triage Notes (Signed)
No response for triage.  

## 2021-01-27 ENCOUNTER — Emergency Department (HOSPITAL_COMMUNITY)
Admission: EM | Admit: 2021-01-27 | Discharge: 2021-01-27 | Disposition: A | Discharge status: Home / Self Care | Payer: No Typology Code available for payment source | Attending: Emergency Medicine | Admitting: Emergency Medicine

## 2021-01-27 ENCOUNTER — Other Ambulatory Visit: Payer: Self-pay

## 2021-01-27 ENCOUNTER — Emergency Department (HOSPITAL_COMMUNITY): Payer: No Typology Code available for payment source

## 2021-01-27 ENCOUNTER — Encounter (HOSPITAL_COMMUNITY): Payer: Self-pay | Admitting: Emergency Medicine

## 2021-01-27 DIAGNOSIS — S92344A Nondisplaced fracture of fourth metatarsal bone, right foot, initial encounter for closed fracture: Secondary | ICD-10-CM | POA: Diagnosis not present

## 2021-01-27 DIAGNOSIS — I10 Essential (primary) hypertension: Secondary | ICD-10-CM | POA: Diagnosis not present

## 2021-01-27 DIAGNOSIS — F17210 Nicotine dependence, cigarettes, uncomplicated: Secondary | ICD-10-CM | POA: Insufficient documentation

## 2021-01-27 DIAGNOSIS — Y92410 Unspecified street and highway as the place of occurrence of the external cause: Secondary | ICD-10-CM | POA: Insufficient documentation

## 2021-01-27 DIAGNOSIS — S99921A Unspecified injury of right foot, initial encounter: Secondary | ICD-10-CM | POA: Diagnosis present

## 2021-01-27 DIAGNOSIS — Z79899 Other long term (current) drug therapy: Secondary | ICD-10-CM | POA: Diagnosis not present

## 2021-01-27 MED ORDER — OXYCODONE-ACETAMINOPHEN 5-325 MG PO TABS: 1.0000 | ORAL_TABLET | Freq: Three times a day (TID) | ORAL | 0 refills | Status: DC | PRN
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
## 2021-01-27 NOTE — Discharge Instructions (Addendum)
Follow-up with orthopedic surgery.  Try not to weight-bear on the foot.

## 2021-01-27 NOTE — ED Notes (Signed)
Applied CAM boot to affected extremity, RLE. Noted patient ambulated using cane with no issue.
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
## 2021-02-13 ENCOUNTER — Other Ambulatory Visit: Payer: Self-pay

## 2021-02-13 ENCOUNTER — Emergency Department (HOSPITAL_COMMUNITY)
Admission: EM | Admit: 2021-02-13 | Discharge: 2021-02-14 | Disposition: A | Discharge status: Home / Self Care | Payer: No Typology Code available for payment source | Attending: Emergency Medicine | Admitting: Emergency Medicine

## 2021-02-13 ENCOUNTER — Emergency Department (HOSPITAL_COMMUNITY): Payer: No Typology Code available for payment source

## 2021-02-13 DIAGNOSIS — S76211D Strain of adductor muscle, fascia and tendon of right thigh, subsequent encounter: Secondary | ICD-10-CM | POA: Diagnosis not present

## 2021-02-13 DIAGNOSIS — F17210 Nicotine dependence, cigarettes, uncomplicated: Secondary | ICD-10-CM | POA: Insufficient documentation

## 2021-02-13 DIAGNOSIS — Z79899 Other long term (current) drug therapy: Secondary | ICD-10-CM | POA: Diagnosis not present

## 2021-02-13 DIAGNOSIS — I10 Essential (primary) hypertension: Secondary | ICD-10-CM | POA: Insufficient documentation

## 2021-02-13 DIAGNOSIS — R52 Pain, unspecified: Secondary | ICD-10-CM

## 2021-02-13 DIAGNOSIS — S76801D Unspecified injury of other specified muscles, fascia and tendons at thigh level, right thigh, subsequent encounter: Secondary | ICD-10-CM | POA: Diagnosis present

## 2021-02-13 NOTE — ED Notes (Signed)
Pt seen walking around to lobby and outside without a problem.
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
## 2021-02-14 DIAGNOSIS — S76211D Strain of adductor muscle, fascia and tendon of right thigh, subsequent encounter: Secondary | ICD-10-CM | POA: Diagnosis not present

## 2021-02-14 MED ORDER — OXYCODONE-ACETAMINOPHEN 5-325 MG PO TABS
2.0000 | ORAL_TABLET | Freq: Once | ORAL | Status: AC
  Administered 2021-02-14: 2 via ORAL
  Filled 2021-02-14: qty 2

## 2021-02-14 MED ORDER — MELOXICAM 7.5 MG PO TABS
15.0000 mg | ORAL_TABLET | Freq: Once | ORAL | Status: AC
  Administered 2021-02-14: 15 mg via ORAL
  Filled 2021-02-14: qty 2

## 2021-02-14 NOTE — ED Notes (Signed)
Received pt from lobby at this time. 
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
## 2021-02-22 ENCOUNTER — Ambulatory Visit (INDEPENDENT_AMBULATORY_CARE_PROVIDER_SITE_OTHER): Payer: Medicaid Other

## 2021-02-22 ENCOUNTER — Encounter: Payer: Self-pay | Admitting: Surgical

## 2021-02-22 ENCOUNTER — Ambulatory Visit (INDEPENDENT_AMBULATORY_CARE_PROVIDER_SITE_OTHER): Payer: Medicaid Other | Admitting: Surgical

## 2021-02-22 ENCOUNTER — Other Ambulatory Visit: Payer: Self-pay

## 2021-02-22 DIAGNOSIS — M1611 Unilateral primary osteoarthritis, right hip: Secondary | ICD-10-CM | POA: Diagnosis not present

## 2021-02-22 DIAGNOSIS — M79671 Pain in right foot: Secondary | ICD-10-CM | POA: Diagnosis not present

## 2021-02-22 DIAGNOSIS — F1991 Other psychoactive substance use, unspecified, in remission: Secondary | ICD-10-CM

## 2021-02-22 DIAGNOSIS — S92341A Displaced fracture of fourth metatarsal bone, right foot, initial encounter for closed fracture: Secondary | ICD-10-CM | POA: Diagnosis not present

## 2021-02-22 DIAGNOSIS — Z87898 Personal history of other specified conditions: Secondary | ICD-10-CM | POA: Diagnosis not present

## 2021-02-22 NOTE — Progress Notes (Signed)
r foo

## 2021-02-23 ENCOUNTER — Encounter: Payer: Self-pay | Admitting: Surgical
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
## 2021-02-27 ENCOUNTER — Encounter (HOSPITAL_COMMUNITY): Payer: Self-pay

## 2021-02-27 ENCOUNTER — Emergency Department (HOSPITAL_COMMUNITY)
Admission: EM | Admit: 2021-02-27 | Discharge: 2021-02-27 | Disposition: A | Discharge status: Home / Self Care | Payer: No Typology Code available for payment source | Attending: Emergency Medicine | Admitting: Emergency Medicine

## 2021-02-27 ENCOUNTER — Other Ambulatory Visit: Payer: Self-pay

## 2021-02-27 DIAGNOSIS — Y92410 Unspecified street and highway as the place of occurrence of the external cause: Secondary | ICD-10-CM | POA: Insufficient documentation

## 2021-02-27 DIAGNOSIS — Z5321 Procedure and treatment not carried out due to patient leaving prior to being seen by health care provider: Secondary | ICD-10-CM | POA: Insufficient documentation

## 2021-02-27 DIAGNOSIS — M79671 Pain in right foot: Secondary | ICD-10-CM | POA: Diagnosis not present

## 2021-02-27 DIAGNOSIS — M25551 Pain in right hip: Secondary | ICD-10-CM | POA: Diagnosis present
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
## 2021-09-27 ENCOUNTER — Encounter (HOSPITAL_COMMUNITY): Payer: Self-pay | Admitting: Emergency Medicine

## 2021-09-27 ENCOUNTER — Emergency Department (HOSPITAL_COMMUNITY)
Admission: EM | Admit: 2021-09-27 | Discharge: 2021-09-28 | Disposition: A | Discharge status: Home / Self Care | Payer: Medicaid Other | Attending: Emergency Medicine | Admitting: Emergency Medicine

## 2021-09-27 DIAGNOSIS — Y9301 Activity, walking, marching and hiking: Secondary | ICD-10-CM | POA: Insufficient documentation

## 2021-09-27 DIAGNOSIS — Z79899 Other long term (current) drug therapy: Secondary | ICD-10-CM | POA: Insufficient documentation

## 2021-09-27 DIAGNOSIS — S7001XA Contusion of right hip, initial encounter: Secondary | ICD-10-CM | POA: Diagnosis not present

## 2021-09-27 DIAGNOSIS — S79911A Unspecified injury of right hip, initial encounter: Secondary | ICD-10-CM | POA: Diagnosis present

## 2021-09-27 DIAGNOSIS — W230XXA Caught, crushed, jammed, or pinched between moving objects, initial encounter: Secondary | ICD-10-CM | POA: Diagnosis not present

## 2021-09-27 DIAGNOSIS — I10 Essential (primary) hypertension: Secondary | ICD-10-CM | POA: Diagnosis not present

## 2021-09-27 DIAGNOSIS — S7002XA Contusion of left hip, initial encounter: Secondary | ICD-10-CM | POA: Insufficient documentation

## 2021-09-27 DIAGNOSIS — S7000XA Contusion of unspecified hip, initial encounter: Secondary | ICD-10-CM

## 2021-09-27 DIAGNOSIS — F17210 Nicotine dependence, cigarettes, uncomplicated: Secondary | ICD-10-CM | POA: Insufficient documentation

## 2021-09-27 DIAGNOSIS — S7010XA Contusion of unspecified thigh, initial encounter: Secondary | ICD-10-CM
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
## 2021-09-28 ENCOUNTER — Emergency Department (HOSPITAL_COMMUNITY): Payer: Medicaid Other

## 2021-09-28 MED ORDER — IBUPROFEN 800 MG PO TABS
800.0000 mg | ORAL_TABLET | Freq: Once | ORAL | Status: AC
  Administered 2021-09-28: 800 mg via ORAL
  Filled 2021-09-28: qty 1

## 2021-09-28 MED ORDER — ACETAMINOPHEN 500 MG PO TABS
1000.0000 mg | ORAL_TABLET | Freq: Once | ORAL | Status: AC
  Administered 2021-09-28: 1000 mg via ORAL
  Filled 2021-09-28: qty 2

## 2022-05-26 ENCOUNTER — Other Ambulatory Visit: Payer: Self-pay

## 2022-05-26 ENCOUNTER — Emergency Department (HOSPITAL_COMMUNITY)
Admission: EM | Admit: 2022-05-26 | Discharge: 2022-05-26 | Disposition: A | Discharge status: Home / Self Care | Payer: Medicaid Other | Attending: Emergency Medicine | Admitting: Emergency Medicine

## 2022-05-26 ENCOUNTER — Encounter (HOSPITAL_COMMUNITY): Payer: Self-pay

## 2022-05-26 DIAGNOSIS — I10 Essential (primary) hypertension: Secondary | ICD-10-CM | POA: Diagnosis not present

## 2022-05-26 DIAGNOSIS — Z79899 Other long term (current) drug therapy: Secondary | ICD-10-CM | POA: Diagnosis not present

## 2022-05-26 DIAGNOSIS — L89321 Pressure ulcer of left buttock, stage 1: Secondary | ICD-10-CM | POA: Insufficient documentation

## 2022-05-26 LAB — BASIC METABOLIC PANEL
Anion gap: 14 (ref 5–15)
BUN: 21 mg/dL (ref 8–23)
CO2: 23 mmol/L (ref 22–32)
Calcium: 9.6 mg/dL (ref 8.9–10.3)
Chloride: 104 mmol/L (ref 98–111)
Creatinine, Ser: 0.93 mg/dL (ref 0.61–1.24)
GFR, Estimated: >60 mL/min (ref >60)
Glucose, Bld: 114 mg/dL — ABNORMAL HIGH (ref 70–99)
Potassium: 3.5 mmol/L (ref 3.5–5.1)
Sodium: 141 mmol/L (ref 135–145)

## 2022-05-26 LAB — CBC
HCT: 41.9 % (ref 39.0–52.0)
Hemoglobin: 14.9 g/dL (ref 13.0–17.0)
MCH: 27.9 pg (ref 26.0–34.0)
MCHC: 35.6 g/dL (ref 30.0–36.0)
MCV: 78.3 fL — ABNORMAL LOW (ref 80.0–100.0)
Platelets: 243 10*3/uL (ref 150–400)
RBC: 5.35 MIL/uL (ref 4.22–5.81)
RDW: 15.5 % (ref 11.5–15.5)
WBC: 10.5 10*3/uL (ref 4.0–10.5)
nRBC: 0 % (ref 0.0–0.2)

## 2022-05-26 NOTE — ED Notes (Signed)
Pt refused blood work stated there are drugs in his system. EDP made aware.

## 2022-05-26 NOTE — ED Notes (Addendum)
Called pt x3 no response.
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
## 2022-05-26 NOTE — ED Provider Notes (Signed)
[name] EMERGENCY DEPARTMENT Provider Note   CSN: [id number] Arrival date & time: 05/26/22  0708     History  Chief Complaint  Patient presents with   Wound Check   Rash    Douglas Glass is a 61 y.o. male with history of sleep apnea, hypertension, hypercholesterolemia, chronic back pain, BPH.  Patient presents ED for evaluation of sacral wound as well as "bugs biting me".  Patient states that for the last 2 days he has had a progressively worsening wound to his bottom.  Patient reports that he does a lot of sitting.  Patient believes that this wound has been caused by bugs that are living in his apartment with him and "eating him".  Patient states that he has attempted multiple times to have his apartment fumigated however his apartment complex advised multiple times that there are no bugs in his apartment and that the apartment does not need to be fumigated.  Patient has a collection of bugs with him as well as multiple bags and pieces of luggage.  Patient states that he does smoke crack however denies methamphetamine use.  Patient states he has smoked crack for 10 years, states that the last time he smoked crack was last night.  Patient denies any fevers, nausea, vomiting, shortness of breath, chest pain, bodyaches or chills.   Wound Check Pertinent negatives include no chest pain and no shortness of breath.  Rash Associated symptoms: no fever, no nausea, no shortness of breath and not vomiting        Home Medications Prior to Admission medications   Medication Sig Start Date End Date Taking? Authorizing Provider  allopurinol (ZYLOPRIM) 100 MG tablet Take 100 mg by mouth daily. 02/06/21   [provider]  amLODipine (NORVASC) 10 MG tablet Take 1 tablet (10 mg total) by mouth daily. 01/24/19   Grayce Sessions, NP  gabapentin (NEURONTIN) 300 MG capsule Take 300 mg by mouth 3 (three) times daily. 02/19/21   [provider]   lisinopril-hydrochlorothiazide (ZESTORETIC) 20-12.5 MG tablet Take 2 tablets by mouth daily. 01/24/19   Grayce Sessions, NP  meloxicam (MOBIC) 15 MG tablet Take 15 mg by mouth daily. 02/19/21   [provider]  oxyCODONE-acetaminophen (PERCOCET/ROXICET) 5-325 MG tablet Take 1-2 tablets by mouth every 8 (eight) hours as needed for severe pain. 01/27/21   Benjiman Core, MD  SUBOXONE 8-2 MG FILM Place under the tongue 2 (two) times daily. 02/06/21   [provider]  tamsulosin (FLOMAX) 0.4 MG CAPS capsule Take 1 capsule (0.4 mg total) by mouth daily. 01/24/19   Grayce Sessions, NP      Allergies    Patient has no known allergies.    Review of Systems   Review of Systems  Constitutional:  Negative for chills and fever.  Respiratory:  Negative for shortness of breath.   Cardiovascular:  Negative for chest pain.  Gastrointestinal:  Negative for nausea and vomiting.  Skin:  Positive for wound.  All other systems reviewed and are negative.   Physical Exam Updated Vital Signs BP 121/67 (BP Location: Left Arm)   Pulse 62   Temp 98.3 F (36.8 C) (Oral)   Resp 20   Ht 5\' 7"  (1.702 m)   Wt 92.5 kg   SpO2 94%   BMI 31.95 kg/m  Physical Exam Vitals and nursing note reviewed.  Constitutional:      General: He is not in acute distress.    Appearance: Normal appearance.  He is not ill-appearing, toxic-appearing or diaphoretic.  HENT:     Head: Normocephalic and atraumatic.     Nose: Nose normal. No congestion.     Mouth/Throat:     Mouth: Mucous membranes are moist.     Pharynx: Oropharynx is clear.  Eyes:     Extraocular Movements: Extraocular movements intact.     Conjunctiva/sclera: Conjunctivae normal.     Pupils: Pupils are equal, round, and reactive to light.  Cardiovascular:     Rate and Rhythm: Normal rate and regular rhythm.  Pulmonary:     Effort: Pulmonary effort is normal.     Breath sounds: Normal breath sounds.  Abdominal:     General: Abdomen  is flat. Bowel sounds are normal.     Palpations: Abdomen is soft.     Tenderness: There is no abdominal tenderness.  Musculoskeletal:     Cervical back: Normal range of motion and neck supple. No tenderness.  Skin:    General: Skin is warm and dry.     Capillary Refill: Capillary refill takes less than 2 seconds.     Findings: Wound present.     Comments: 3 cm stage I pressure ulcer noted to patient sacrum  Neurological:     Mental Status: He is alert and oriented to person, place, and time.     ED Results / Procedures / Treatments   Labs (all labs ordered are listed, but only abnormal results are displayed) Labs Reviewed  CBC - Abnormal; Notable for the following components:      Result Value   MCV 78.3 (*)    All other components within normal limits  BASIC METABOLIC PANEL - Abnormal; Notable for the following components:   Glucose, Bld 114 (*)    All other components within normal limits    EKG None  Radiology No results found.  Procedures Procedures   Medications Ordered in ED Medications - No data to display  ED Course/ Medical Decision Making/ A&P                           Medical Decision Making Amount and/or Complexity of Data Reviewed Labs: ordered.   61 year old male presents to ED for evaluation.  Please see HPI for further details.  On examination, patient afebrile, nontachycardic.  Patient lung sounds clear bilaterally.  Patient abdomen soft and compressible in all 4 quadrants.  Patient neurological examination shows no focal neurodeficits.  The patient does have a stage I pressure ulcer noted to his sacral area.    Patient has multiple excoriations and scratch marks throughout his entire body.  Patient admits to crack cocaine use night prior.  Patient states that he feels as if "bugs are eating him" and states he has multiple areas on his head that he feels are infected.  I inspected the patient's scalp, there is no overlying skin change or wound in  need of care.  I advised the patient that we would cover his pressure ulcer with bandaging, collect lab work.  Nursing staff came to notify me that the patient was refusing lab work because "I have drugs in my system and you wont be able to see the results". I advised the patient that in order to properly assess him we needed the lab work.  The patient was agreeable to having blood work done at this time.  After patient lab work was collected, the patient stated that he wanted to be admitted because  he did not believe that his apartment was livable.  The patient states that he brought all of his belongings with him in anticipation of being admitted.  When I advised the patient that his clinical presentation did not necessitate admission he grew angry, stating that he did not know "what we were doing for him".  I advised the patient that we have covered his wounds and we will check his lab work to ensure that there is no underlying abnormality that we need to address.  I advised the patient that his living situation was unfortunate however we at the hospital did not have any control over it.  Update: Nursing staff came to notify me that the patient has eloped.  Patient has left AGAINST MEDICAL ADVICE.  Final Clinical Impression(s) / ED Diagnoses Final diagnoses:  Pressure injury of left buttock, stage 1    Rx / DC Orders ED Discharge Orders     None         Al Decant, PA-C 05/26/22 1523    Pricilla Loveless, MD 05/27/22 [phone]

## 2022-05-26 NOTE — ED Triage Notes (Signed)
Patient has wound in between buttoch.  ALso complains of bugs in his house that are eating him.

## 2022-05-26 NOTE — ED Notes (Signed)
Pt walked out wo notifying nursing staff. EDP made aware.

## 2022-06-18 ENCOUNTER — Emergency Department (HOSPITAL_COMMUNITY)
Admission: EM | Admit: 2022-06-18 | Discharge: 2022-06-19 | Disposition: A | Discharge status: Home / Self Care | Payer: Medicaid Other | Attending: Emergency Medicine | Admitting: Emergency Medicine

## 2022-06-18 DIAGNOSIS — Z207 Contact with and (suspected) exposure to pediculosis, acariasis and other infestations: Secondary | ICD-10-CM | POA: Diagnosis present

## 2022-06-18 DIAGNOSIS — Z79899 Other long term (current) drug therapy: Secondary | ICD-10-CM | POA: Insufficient documentation

## 2022-06-18 DIAGNOSIS — I10 Essential (primary) hypertension: Secondary | ICD-10-CM | POA: Diagnosis not present

## 2022-06-18 NOTE — ED Provider Triage Note (Cosign Needed)
Emergency Medicine Provider Triage Evaluation Note  Douglas Glass , a 61 y.o. male  was evaluated in triage.  Pt complains of bugs in his apartment, and his skin and on his head.  States has been going on for "a long time".  When asked what brought was different prompting trip to the ED today it he says "are you serious?" States he wants [place] to kill the bugs in his body. No SI/HI.   Review of Systems  Per HPI  Physical Exam  BP (!) 165/86 (BP Location: Right Arm)   Pulse 81   Temp 98.1 F (36.7 C)   Resp 18   SpO2 97%  Gen:   Awake, no distress.  Patient has suitcase with his belongings at side. Resp:  Normal effort  MSK:   Moves extremities without difficulty  Other:  Some excoriation marks on his arm but no bugs on his skin.  He has normal veins.  Medical Decision Making  Medically screening exam initiated at 12:02 AM.  Appropriate orders placed.  Douglas Glass was informed that the remainder of the evaluation will be completed by another provider, this initial triage assessment does not replace that evaluation, and the importance of remaining in the ED until their evaluation is complete.  Advised patient she will need to call an exterminator, discussed topical treatments for any itching.  Patient states she need somebody to kill the bugs inside of his body.   Not septic, denies any SI or HI   Theron Arista, PA-C 06/19/22 0002    Theron Arista, PA-C 06/19/22 0005

## 2022-06-18 NOTE — ED Triage Notes (Signed)
Pt reported to ED with c/o "carpet beetles" that have been biting his head.

## 2022-06-19 ENCOUNTER — Other Ambulatory Visit: Payer: Self-pay

## 2022-06-19 ENCOUNTER — Encounter (HOSPITAL_COMMUNITY): Payer: Self-pay | Admitting: Emergency Medicine

## 2022-06-19 MED ORDER — PERMETHRIN 5 % EX CREA
TOPICAL_CREAM | CUTANEOUS | 0 refills | Status: DC
Start: 2022-06-19 — End: 2023-05-25

## 2022-06-19 MED ORDER — PERMETHRIN 5 % EX CREA
TOPICAL_CREAM | Freq: Once | CUTANEOUS | Status: DC
  Filled 2022-06-19: qty 60

## 2022-06-19 NOTE — ED Notes (Signed)
Patient in recliner. States he doesn't want ay vitals taken

## 2022-06-19 NOTE — ED Provider Notes (Signed)
[name] EMERGENCY DEPARTMENT Provider Note   CSN: [id number] Arrival date & time: 06/18/22  2347     History  Chief Complaint  Patient presents with   Insect Bite    Douglas Glass is a 60 y.o. male.  Douglas Glass is a 61 y.o. male With a history of hypertension, hyperlipidemia, BPH, oback pain, who presents to the ED with complaints of bugs crawling all over his body, particularly to his head and buttocks. Pt was seen in the ED on 6/19 for similar complaints. Symptoms remain unchanged since that visit. Currently he is uncomfortable with "bugs present all over his body... inside of his skin". Today he is concerned because he is "exhausted and drained from dealing with the beetles". Per patient the bites are not painful, but he scratches his skin to "dig out the bugs". No home remedies attempted pta. No fever, CP, SOB, N/V/D or any other acute concerns.  Patient reports he is convinced that his apartment is infested and he is not planning on returning to his apartment.  The history is provided by the patient and medical records.       Home Medications Prior to Admission medications   Medication Sig Start Date End Date Taking? Authorizing Provider  permethrin (ELIMITE) 5 % cream Apply to affected area once 1 week after initial treatment if continuing to have symptoms 06/19/22  Yes Dartha Lodge, PA-C  allopurinol (ZYLOPRIM) 100 MG tablet Take 100 mg by mouth daily. 02/06/21   [provider]  amLODipine (NORVASC) 10 MG tablet Take 1 tablet (10 mg total) by mouth daily. 01/24/19   Grayce Sessions, NP  gabapentin (NEURONTIN) 300 MG capsule Take 300 mg by mouth 3 (three) times daily. 02/19/21   [provider]  lisinopril-hydrochlorothiazide (ZESTORETIC) 20-12.5 MG tablet Take 2 tablets by mouth daily. 01/24/19   Grayce Sessions, NP  meloxicam (MOBIC) 15 MG tablet Take 15 mg by mouth daily. 02/19/21   [provider]   oxyCODONE-acetaminophen (PERCOCET/ROXICET) 5-325 MG tablet Take 1-2 tablets by mouth every 8 (eight) hours as needed for severe pain. 01/27/21   Benjiman Core, MD  SUBOXONE 8-2 MG FILM Place under the tongue 2 (two) times daily. 02/06/21   [provider]  tamsulosin (FLOMAX) 0.4 MG CAPS capsule Take 1 capsule (0.4 mg total) by mouth daily. 01/24/19   Grayce Sessions, NP      Allergies    Patient has no known allergies.    Review of Systems   Review of Systems  Constitutional:  Negative for chills and fever.  Skin:  Negative for rash.       Pruritis    Physical Exam Updated Vital Signs BP 128/81 (BP Location: Left Arm)   Pulse 69   Temp 98.4 F (36.9 C) (Oral)   Resp 14   SpO2 98%  Physical Exam Vitals and nursing note reviewed.  Constitutional:      General: He is not in acute distress.    Appearance: Normal appearance. He is well-developed. He is not ill-appearing or diaphoretic.     Comments: Alert, appears anxious but in no acute distress  HENT:     Head: Normocephalic and atraumatic.  Eyes:     General:        Right eye: No discharge.        Left eye: No discharge.  Pulmonary:     Effort: Pulmonary effort is normal. No respiratory distress.  Skin:    General:  Skin is warm and dry.     Comments: Some chronic appearing excoriations, but no rash or bugs noted.  1 small scab on the back of the scalp but no other wounds or lesions noted.  Neurological:     Mental Status: He is alert and oriented to person, place, and time.     Coordination: Coordination normal.  Psychiatric:        Mood and Affect: Mood normal.        Behavior: Behavior normal.     ED Results / Procedures / Treatments   Labs (all labs ordered are listed, but only abnormal results are displayed) Labs Reviewed - No data to display  EKG None  Radiology No results found.  Procedures Procedures    Medications Ordered in ED Medications  permethrin (ELIMITE) 5 % cream (has no  administration in time range)    ED Course/ Medical Decision Making/ A&P                           Medical Decision Making Risk Prescription drug management.   Patient arrives with concern for bugs in his apartment that have crawled under his skin.  He reports that he has been scratching at them to get them out.  He has no constitutional symptoms or other complaints but it is very perseverative and concerned with this.  He has not called an exterminator but reports he wants to leave this apartment.  Patient is insistent that he has bugs crawling on him and attempts to show them to me multiple times.  Patient seen for similar 1 month ago and at this time also endorsed using cocaine prior to symptoms starting. Though I do not see clear evidence of bugs on patient, if he has potentially been exposed to scabies or bedbugs we will provide him with permethrin which she can use at home, and given prescription for 1 refill for repeat treatment.  Patient is encouraged to contact exterminator for further evaluation of bug problem and given information on how to clean his linens close and helpful products.  No other rashes or skin changes noted.  Discharged in good condition.  At this time there does not appear to be any evidence of an acute emergency medical condition requiring further emergent evaluation and the patient appears stable for discharge with appropriate outpatient follow up. Diagnosis and return precautions discussed with patient who verbalizes understanding and is agreeable to discharge.           Final Clinical Impression(s) / ED Diagnoses Final diagnoses:  Contact with and (suspected) exposure to pediculosis, acariasis and other infestations    Rx / DC Orders ED Discharge Orders          Ordered    permethrin (ELIMITE) 5 % cream        06/19/22 1006              Jodi Geralds [place], [place] 06/19/22 1617    Milagros Loll, MD 06/20/22 [phone]

## 2022-06-19 NOTE — ED Notes (Signed)
Refused vitals 

## 2022-06-19 NOTE — Discharge Instructions (Signed)
Apply permethrin cream to the scalp and from the neck down, avoiding the face, I recommend sleeping in this cream as it needs to remain in place for at least 8 hours and afterwards you can shower to remove the cream.  If symptoms persist after 1 week repeat treatment a second time.  If you are still experiencing sensation of bugs please follow-up with your regular doctor.  Wash all linens and clothes in hot water.

## 2022-06-19 NOTE — ED Notes (Signed)
X3 no response 

## 2022-07-16 ENCOUNTER — Emergency Department (HOSPITAL_COMMUNITY)
Admission: EM | Admit: 2022-07-16 | Discharge: 2022-07-16 | Disposition: A | Discharge status: Home / Self Care | Payer: Medicaid Other | Attending: Emergency Medicine | Admitting: Emergency Medicine

## 2022-07-16 DIAGNOSIS — B370 Candidal stomatitis: Secondary | ICD-10-CM | POA: Insufficient documentation

## 2022-07-16 DIAGNOSIS — Z5321 Procedure and treatment not carried out due to patient leaving prior to being seen by health care provider: Secondary | ICD-10-CM | POA: Diagnosis not present

## 2022-07-16 NOTE — ED Triage Notes (Signed)
Pt via [facility] c/o "worms crawling in & out of lips/body." Scabs noted to lips from picking. Psych hx

## 2022-07-16 NOTE — ED Provider Triage Note (Signed)
Emergency Medicine Provider Triage Evaluation Note  Douglas Glass , a 61 y.o. male  was evaluated in triage.  Pt complains of worms in his mouth. States that whie worms were living in his mouth and that he could see them traveling from side of his upper lip to the other. He applied table salt to his lip and is certain that he killed them but he is concerned that they will return. Denies drug or alchol use. Has been seen for the same complaint multiple times this summer.   Review of Systems  Positive: As above Negative: As above  Physical Exam  BP 134/88 (BP Location: Right Arm)   Pulse 75   Temp 97.8 F (36.6 C) (Oral)   Resp 18   SpO2 98%  Gen:   Awake, no distress   Resp:  Normal effort  MSK:   Moves extremities without difficulty  Other:  \  Medical Decision Making  Medically screening exam initiated at 2:40 AM.  Appropriate orders placed.  Divine Rosenzweig was informed that the remainder of the evaluation will be completed by another provider, this initial triage assessment does not replace that evaluation, and the importance of remaining in the ED until their evaluation is complete.  No orders at this time.   Gareth Eagle, PA-C 07/16/22 [phone]

## 2022-07-19 ENCOUNTER — Encounter (HOSPITAL_COMMUNITY): Payer: Self-pay

## 2022-07-19 ENCOUNTER — Emergency Department (HOSPITAL_COMMUNITY)
Admission: EM | Admit: 2022-07-19 | Discharge: 2022-07-19 | Disposition: A | Discharge status: Home / Self Care | Payer: Medicaid Other | Attending: Emergency Medicine | Admitting: Emergency Medicine

## 2022-07-19 ENCOUNTER — Other Ambulatory Visit: Payer: Self-pay

## 2022-07-19 DIAGNOSIS — Z711 Person with feared health complaint in whom no diagnosis is made: Secondary | ICD-10-CM | POA: Insufficient documentation

## 2022-07-19 DIAGNOSIS — F17210 Nicotine dependence, cigarettes, uncomplicated: Secondary | ICD-10-CM | POA: Insufficient documentation

## 2022-07-19 DIAGNOSIS — I10 Essential (primary) hypertension: Secondary | ICD-10-CM | POA: Insufficient documentation

## 2022-07-19 MED ORDER — IVERMECTIN 0.5 % EX LOTN: TOPICAL_LOTION | CUTANEOUS | 0 refills | Status: AC

## 2022-07-19 NOTE — ED Triage Notes (Signed)
Pt BIB EMS with reports of worms in his body.

## 2022-07-19 NOTE — ED Provider Notes (Signed)
[facility]-EMERGENCY DEPT Provider Note: Lowella Dell, MD, FACEP  CSN: [id number] MRN: [id number] ARRIVAL: 07/19/22 at 0046 ROOM: WA15/WA15   CHIEF COMPLAINT  worms   HISTORY OF PRESENT ILLNESS  07/19/22 1:59 AM Douglas Glass is a 61 y.o. male who believes he has worms throughout his body.  Several days ago he noticed what he believes are tiny, white, hair-like worms emerging from his arms, his gums and his forehead.  There is no significant pain or itching associated with these reported worms.  He thinks he may have eaten something recently that was contaminated with some type of eggs.  He is having back pain but this is not a new problem.  His medical history is significant only for hypertension.  He denies any GI symptoms.   Past Medical History:  Diagnosis Date   Hypertension     History reviewed. No pertinent surgical history.  History reviewed. No pertinent family history.  Social History   Tobacco Use   Smoking status: Every Day    Types: Cigarettes  Substance Use Topics   Alcohol use: Not Currently   Drug use: Yes    Types: Marijuana    Prior to Admission medications   Medication Sig Start Date End Date Taking? Authorizing Provider  Ivermectin 0.5 % LOTN Apply to skin and leave in place for 10 minutes then shower 07/19/22  Yes Kamiya Acord, Jonny Ruiz, MD    Allergies Patient has no allergy information on record.   REVIEW OF SYSTEMS  Negative except as noted here or in the History of Present Illness.   PHYSICAL EXAMINATION  Initial Vital Signs Blood pressure (!) 143/88, pulse 75, temperature 97.9 F (36.6 C), temperature source Oral, resp. rate 20, height 5\' 7"  (1.702 m), weight 96.6 kg, SpO2 94 %.  Examination General: Well-developed, well-nourished male in no acute distress; appearance consistent with age of record HENT: normocephalic; atraumatic Eyes: Normal appearance Neck: supple Heart: regular rate and rhythm Lungs: clear to auscultation bilaterally Abdomen:  soft; nondistended; nontender; bowel sounds present Extremities: No deformity; full range of motion Neurologic: Awake, alert and oriented; motor function intact in all extremities and symmetric; no facial droop Skin: Warm and dry; no rash seen; no parasites seen; scattered hyperpigmented macules of forearms which appear subacute Psychiatric: Flat affect   RESULTS  Summary of this visit's results, reviewed and interpreted by myself:   EKG Interpretation  Date/Time:    Ventricular Rate:    PR Interval:    QRS Duration:   QT Interval:    QTC Calculation:   R Axis:     Text Interpretation:         Laboratory Studies: No results found for this or any previous visit (from the past 24 hour(s)). Imaging Studies: No results found.  ED COURSE and MDM  Nursing notes, initial and subsequent vitals signs, including pulse oximetry, reviewed and interpreted by myself.  Vitals:   07/19/22 0059 07/19/22 0111  BP: (!) 143/88   Pulse: 75   Resp: 20   Temp: 97.9 F (36.6 C)   TempSrc: Oral   SpO2: 94%   Weight:  96.6 kg  Height:  5\' 7"  (1.702 m)   Medications - No data to display  No worms are appreciated on exam.  This could represent a delusional condition such as [place]'s.  He does not have the rash characteristic of scabies.  Since this is a new, acute complaint I think it is reasonable to treat with a single dose of ivermectin topically  for possible subclinical infestation but I do not believe any long-term therapy is indicated.  He will be advised to follow-up with his primary care physician with any further concerns.  PROCEDURES  Procedures   ED DIAGNOSES     ICD-10-CM   1. Concern about skin disease without diagnosis  Z71.1          Raife Lizer, MD 07/19/22 [phone]

## 2022-07-21 ENCOUNTER — Encounter (HOSPITAL_COMMUNITY): Payer: Self-pay | Admitting: Emergency Medicine

## 2022-12-15 ENCOUNTER — Emergency Department (HOSPITAL_COMMUNITY)
Admission: EM | Admit: 2022-12-15 | Discharge: 2022-12-16 | Disposition: A | Discharge status: Home / Self Care | Payer: Medicaid Other | Attending: Emergency Medicine | Admitting: Emergency Medicine

## 2022-12-15 DIAGNOSIS — M25551 Pain in right hip: Secondary | ICD-10-CM | POA: Insufficient documentation

## 2022-12-15 DIAGNOSIS — R519 Headache, unspecified: Secondary | ICD-10-CM | POA: Diagnosis not present

## 2022-12-16 ENCOUNTER — Emergency Department (HOSPITAL_COMMUNITY): Payer: Medicaid Other

## 2022-12-16 ENCOUNTER — Other Ambulatory Visit: Payer: Self-pay

## 2022-12-16 MED ORDER — ACETAMINOPHEN 500 MG PO TABS
1000.0000 mg | ORAL_TABLET | Freq: Once | ORAL | Status: AC
  Administered 2022-12-16: 1000 mg via ORAL
  Filled 2022-12-16: qty 2

## 2022-12-16 MED ORDER — PROCHLORPERAZINE EDISYLATE 10 MG/2ML IJ SOLN
10.0000 mg | Freq: Once | INTRAMUSCULAR | Status: DC
  Filled 2022-12-16: qty 2

## 2022-12-16 MED ORDER — METHOCARBAMOL 500 MG PO TABS: 500.0000 mg | ORAL_TABLET | Freq: Three times a day (TID) | ORAL | 0 refills | Status: AC | PRN

## 2022-12-16 MED ORDER — PROCHLORPERAZINE EDISYLATE 10 MG/2ML IJ SOLN
10.0000 mg | Freq: Once | INTRAMUSCULAR | Status: AC
  Administered 2022-12-16: 10 mg via INTRAMUSCULAR

## 2022-12-16 MED ORDER — KETOROLAC TROMETHAMINE 60 MG/2ML IM SOLN
60.0000 mg | Freq: Once | INTRAMUSCULAR | Status: AC
  Administered 2022-12-16: 60 mg via INTRAMUSCULAR
  Filled 2022-12-16: qty 2

## 2022-12-16 MED ORDER — NAPROXEN 375 MG PO TABS: 375.0000 mg | ORAL_TABLET | Freq: Two times a day (BID) | ORAL | 0 refills | Status: DC

## 2022-12-16 MED ORDER — DIPHENHYDRAMINE HCL 25 MG PO CAPS
25.0000 mg | ORAL_CAPSULE | Freq: Once | ORAL | Status: AC
  Administered 2022-12-16: 25 mg via ORAL
  Filled 2022-12-16: qty 1

## 2022-12-16 NOTE — Discharge Instructions (Signed)

## 2022-12-16 NOTE — ED Notes (Signed)
Pt called repeatedly to be triaged, no response
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
## 2022-12-16 NOTE — ED Notes (Signed)
Pt called to be triaged, no response  
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
## 2022-12-16 NOTE — ED Triage Notes (Signed)
Patient reports migraine headache for 1 week.

## 2023-03-24 ENCOUNTER — Ambulatory Visit (HOSPITAL_COMMUNITY)
Admission: EM | Admit: 2023-03-24 | Discharge: 2023-03-24 | Disposition: A | Discharge status: Home / Self Care | Payer: Medicaid Other | Attending: Family Medicine | Admitting: Family Medicine

## 2023-03-24 NOTE — ED Notes (Signed)
Pt states he will go to ER he is only here for Cortisone injection in his groin. He states if I can not guarantee him a cortisone injection in his groin he doesn't even know why he is here. He left before triage to go to ER>

## 2023-04-14 ENCOUNTER — Encounter (HOSPITAL_COMMUNITY): Payer: Self-pay | Admitting: Emergency Medicine

## 2023-04-14 ENCOUNTER — Other Ambulatory Visit: Payer: Self-pay

## 2023-04-14 ENCOUNTER — Emergency Department (HOSPITAL_COMMUNITY): Payer: Medicaid Other

## 2023-04-14 ENCOUNTER — Emergency Department (HOSPITAL_COMMUNITY)
Admission: EM | Admit: 2023-04-14 | Discharge: 2023-04-14 | Disposition: A | Discharge status: Home / Self Care | Payer: Medicaid Other | Attending: Emergency Medicine | Admitting: Emergency Medicine

## 2023-04-14 DIAGNOSIS — I10 Essential (primary) hypertension: Secondary | ICD-10-CM | POA: Insufficient documentation

## 2023-04-14 DIAGNOSIS — M19071 Primary osteoarthritis, right ankle and foot: Secondary | ICD-10-CM | POA: Diagnosis not present

## 2023-04-14 DIAGNOSIS — M1611 Unilateral primary osteoarthritis, right hip: Secondary | ICD-10-CM | POA: Diagnosis not present

## 2023-04-14 DIAGNOSIS — Z79899 Other long term (current) drug therapy: Secondary | ICD-10-CM | POA: Diagnosis not present

## 2023-04-14 DIAGNOSIS — M25551 Pain in right hip: Secondary | ICD-10-CM | POA: Diagnosis present

## 2023-04-14 DIAGNOSIS — M1610 Unilateral primary osteoarthritis, unspecified hip: Secondary | ICD-10-CM

## 2023-04-14 MED ORDER — NAPROXEN 375 MG PO TABS: 375.0000 mg | ORAL_TABLET | Freq: Two times a day (BID) | ORAL | 0 refills | Status: DC

## 2023-04-14 MED ORDER — LIDOCAINE 5 % EX PTCH: 1.0000 | MEDICATED_PATCH | CUTANEOUS | 0 refills | Status: AC

## 2023-04-14 NOTE — ED Triage Notes (Signed)
Pt reports right groin pain that hurts while walking. Pt reports he had an injury to the area 2 years ago but "now it's bothering me again."

## 2023-04-14 NOTE — ED Provider Triage Note (Signed)
Emergency Medicine Provider Triage Evaluation Note  Douglas Glass , a 63 y.o. male  was evaluated in triage.  Pt complains of right-sided groin pain, right foot pain intermittently for the last 2-1/2 years.  Patient denies any new injury, he reports that he had strained it previously.  Patient reports that he is not taking anything for pain, has not followed up with a orthopedic physician.  Patient reports he had a cortisone shot in his butt around a month ago which helped some. He denies penile discharge, testicle swelling or pain.  Review of Systems  Positive: Groin pain, foot pain Negative:   Physical Exam  BP (!) 150/76 (BP Location: Right Arm)   Pulse 68   Temp 98 F (36.7 C) (Oral)   Resp 20   SpO2 98%  Gen:   Awake, no distress   Resp:  Normal effort  MSK:   Moves extremities without difficulty  Other:    Medical Decision Making  Medically screening exam initiated at 2:54 PM.  Appropriate orders placed.  Douglas Glass was informed that the remainder of the evaluation will be completed by another provider, this initial triage assessment does not replace that evaluation, and the importance of remaining in the ED until their evaluation is complete.  Workup initiated in triage    Olene Floss, [place] 04/14/23 1456

## 2023-04-14 NOTE — Discharge Instructions (Addendum)
Take the medications as needed for pain and discomfort.  Extremities did show signs of arthritis in both your hip and your foot.  Follow-up with an orthopedic doctor for further evaluation.

## 2023-04-14 NOTE — ED Provider Notes (Signed)
[place] EMERGENCY DEPARTMENT AT [facility] Provider Note   CSN: [id number] Arrival date & time: 04/14/23  1426     History  Chief Complaint  Patient presents with   Groin Pain    Douglas Glass is a 62 y.o. male.   Groin Pain     Patient has a history of hypercholesterolemia chronic back pain, BPH, hypertension.  He presents ED for persistent groin and foot pain.  Patient states this has been going on now for couple of years.  Initially started after he slipped on a golf course and felt that he strained a muscle in his groin.  Patient has not seen anyone for this.  He denies any swelling in his groin area.  He does not have any testicular pain.  He denies any dysuria.  Patient states the pain increases with walking and moving his leg.  He is also noted some pain in his foot.  He previous had an injury there.  Home Medications Prior to Admission medications   Medication Sig Start Date End Date Taking? Authorizing Provider  lidocaine (LIDODERM) 5 % Place 1 patch onto the skin daily. Remove & Discard patch within 12 hours or as directed by MD (right hip region) 04/14/23  Yes Linwood Dibbles, MD  naproxen (NAPROSYN) 375 MG tablet Take 1 tablet (375 mg total) by mouth 2 (two) times daily. 04/14/23  Yes Linwood Dibbles, MD  allopurinol (ZYLOPRIM) 100 MG tablet Take 100 mg by mouth daily. 02/06/21   [provider]  amLODipine (NORVASC) 10 MG tablet Take 1 tablet (10 mg total) by mouth daily. 01/24/19   Grayce Sessions, NP  gabapentin (NEURONTIN) 300 MG capsule Take 300 mg by mouth 3 (three) times daily. 02/19/21   [provider]  Ivermectin 0.5 % LOTN Apply to skin and leave in place for 10 minutes then shower 07/19/22   Molpus, John, MD  lisinopril-hydrochlorothiazide (ZESTORETIC) 20-12.5 MG tablet Take 2 tablets by mouth daily. 01/24/19   Grayce Sessions, NP  meloxicam (MOBIC) 15 MG tablet Take 15 mg by mouth daily. 02/19/21   [provider]  methocarbamol  (ROBAXIN) 500 MG tablet Take 1 tablet (500 mg total) by mouth 3 (three) times daily as needed for muscle spasms. 12/16/22   Arthor Captain, PA-C  oxyCODONE-acetaminophen (PERCOCET/ROXICET) 5-325 MG tablet Take 1-2 tablets by mouth every 8 (eight) hours as needed for severe pain. 01/27/21   Benjiman Core, MD  permethrin (ELIMITE) 5 % cream Apply to affected area once 1 week after initial treatment if continuing to have symptoms 06/19/22   Dartha Lodge, PA-C  SUBOXONE 8-2 MG FILM Place under the tongue 2 (two) times daily. 02/06/21   [provider]  tamsulosin (FLOMAX) 0.4 MG CAPS capsule Take 1 capsule (0.4 mg total) by mouth daily. 01/24/19   Grayce Sessions, NP      Allergies    Patient has no known allergies.    Review of Systems   Review of Systems  Physical Exam Updated Vital Signs BP (!) 150/76 (BP Location: Right Arm)   Pulse 68   Temp 98 F (36.7 C) (Oral)   Resp 20   Ht 1.702 m (5\' 7" )   Wt 104.3 kg   SpO2 98%   BMI 36.02 kg/m  Physical Exam Vitals and nursing note reviewed.  Constitutional:      General: He is not in acute distress.    Appearance: He is well-developed.  HENT:  Head: Normocephalic and atraumatic.     Right Ear: External ear normal.     Left Ear: External ear normal.  Eyes:     General: No scleral icterus.       Right eye: No discharge.        Left eye: No discharge.     Conjunctiva/sclera: Conjunctivae normal.  Neck:     Trachea: No tracheal deviation.  Cardiovascular:     Rate and Rhythm: Normal rate.  Pulmonary:     Effort: Pulmonary effort is normal. No respiratory distress.     Breath sounds: No stridor.  Abdominal:     General: There is no distension.  Musculoskeletal:        General: Tenderness present. No swelling or deformity.     Cervical back: Neck supple.     Comments: Tenderness palpation right happening in the region of, no mass appreciated, no groin tenderness  Skin:    General: Skin is warm and dry.      Findings: No rash.  Neurological:     Mental Status: He is alert. Mental status is at baseline.     Cranial Nerves: No dysarthria or facial asymmetry.     Motor: No seizure activity.     ED Results / Procedures / Treatments   Labs (all labs ordered are listed, but only abnormal results are displayed) Labs Reviewed - No data to display  EKG None  Radiology DG Foot Complete Right  Result Date: 04/14/2023 CLINICAL DATA:  Chronic intermittent right foot pain.  No injury. EXAM: RIGHT FOOT COMPLETE - 3+ VIEW COMPARISON:  Right foot x-rays dated February 22, 2021. FINDINGS: No acute fracture or dislocation. Postsurgical changes from prior first metatarsal osteotomy again noted. Unchanged mild first MTP joint osteoarthritis. Remaining joint spaces are preserved. Bone mineralization is normal. Soft tissues are unremarkable. IMPRESSION: 1. Unchanged mild first MTP joint osteoarthritis. Electronically Signed   By: Obie Dredge M.D.   On: 04/14/2023 15:33   DG Pelvis 1-2 Views  Result Date: 04/14/2023 CLINICAL DATA:  Right sided groin pain EXAM: PELVIS - 1-2 VIEW COMPARISON:  12/16/2022 FINDINGS: Advanced chronic osteoarthritis of the right hip which could be painful. Left hip appears within normal limits. Mild degenerative arthritis of the sacroiliac joints. Symphysis pubis is normal. IMPRESSION: Advanced chronic osteoarthritis of the right hip which could be painful. Mild degenerative arthritis of the sacroiliac joints. Electronically Signed   By: Paulina Fusi M.D.   On: 04/14/2023 15:28    Procedures Procedures    Medications Ordered in ED Medications - No data to display  ED Course/ Medical Decision Making/ A&P                             Medical Decision Making Risk Prescription drug management.   Possible symptoms may be radicular in nature although it seems to be more located to the hip region.  Possibly related to bursitis versus the arthritis noted on x-rays.  No findings to  suggest hernia or referred pain.  X-rays do show signs of arthritis in his hip and his foot.  Will prescribe NSAIDs Lidoderm patch.  Recommend outpatient follow-up with an orthopedic doctor.  Evaluation and diagnostic testing in the emergency department does not suggest an emergent condition requiring admission or immediate intervention beyond what has been performed at this time.  The patient is safe for discharge and has been instructed to return immediately for worsening symptoms, change in  symptoms or any other concerns.         Final Clinical Impression(s) / ED Diagnoses Final diagnoses:  Arthritis, hip  Arthritis of right foot    Rx / DC Orders ED Discharge Orders          Ordered    lidocaine (LIDODERM) 5 %  Every 24 hours        04/14/23 1548    naproxen (NAPROSYN) 375 MG tablet  2 times daily        04/14/23 1548              Linwood Dibbles, MD 04/14/23 1554

## 2023-04-17 ENCOUNTER — Encounter: Payer: Self-pay | Admitting: Orthopedic Surgery

## 2023-04-17 ENCOUNTER — Ambulatory Visit (INDEPENDENT_AMBULATORY_CARE_PROVIDER_SITE_OTHER): Payer: Medicaid Other | Admitting: Orthopedic Surgery

## 2023-04-17 VITALS — BP 141/90 | HR 89 | Ht 67.0 in | Wt 230.0 lb

## 2023-04-17 DIAGNOSIS — M1611 Unilateral primary osteoarthritis, right hip: Secondary | ICD-10-CM | POA: Diagnosis not present

## 2023-04-17 NOTE — Progress Notes (Signed)
Orthopedic Surgery Progress Note   Assessment: Patient is a 62 y.o. male with right groin pain. Degenerative changes in the right hip joint. Has hip OA   Plan: -Patient has been doing activity modification, naproxen, Tylenol tried help with the pain.  Pain has been getting progressively worse with time and is interfering with his ability to.  We discussed joint replacement as an option for him.  I told him that this is not a surgery that I do though so I will have him see one of my partners. -Weight bearing status: as tolerated -BMI is 36, no history of DM -Told him he may need to quit smoking prior to any joint replacement surgery -Follow up with Dr. Roda Shutters or Dr. Magnus Ivan to discuss joint replacement as an option   ___________________________________________________________________________  Subjective: Patient has had 3 years of right-sided groin pain.  He feels that it is worse when he is walking on it or playing golf.  Pain has been getting progressively worse with time.  It got so severe that he decided to go to the ER this week.  He does not have pain when he is at rest or laying down.  Pain does not radiate past the groin.  He is not having pain in his left hip.  Denies paresthesias and numbness.   Physical Exam:  General: no acute distress, appears stated age Neurologic: alert, answering questions appropriately, following commands Respiratory: unlabored breathing on room air, symmetric chest rise Psychiatric: appropriate affect, normal cadence to speech  MSK:   -Right lower extremity  Hip flexion to 100 degrees, internal rotation to 10 degrees, external rotation to 40 degrees  Positive FADIR, pain through range of motion at the hip, negative FABER Fires hip flexors, quadriceps, hamstrings, tibialis anterior, gastrocnemius and soleus, extensor hallucis longus Plantarflexes and dorsiflexes toes Sensation intact to light touch in sural, saphenous, tibial, deep peroneal, and  superficial peroneal nerve distributions Foot warm and well perfused  Imaging:  XR of the pelvis from 04/14/2023 was independently reviewed and interpreted, showing significant degenerative changes in the right hip. There is joint space narrowing, subchondral sclerosis, and osteophyte formation. No significant degenerative changes seen within the left hip. No fracture or dislocation seen.   Patient name: Douglas Glass Patient MRN: [id number] Date: 04/17/23

## 2023-04-17 NOTE — Addendum Note (Signed)
Addended by: Willia Craze on: 04/17/2023 08:37 AM   Modules accepted: Level of Service

## 2023-04-24 ENCOUNTER — Ambulatory Visit (INDEPENDENT_AMBULATORY_CARE_PROVIDER_SITE_OTHER): Payer: Medicaid Other | Admitting: Orthopaedic Surgery

## 2023-04-24 ENCOUNTER — Other Ambulatory Visit: Payer: Self-pay

## 2023-04-24 ENCOUNTER — Ambulatory Visit: Payer: Medicaid Other | Admitting: Orthopaedic Surgery

## 2023-04-24 ENCOUNTER — Ambulatory Visit (INDEPENDENT_AMBULATORY_CARE_PROVIDER_SITE_OTHER): Payer: Medicaid Other | Admitting: Sports Medicine

## 2023-04-24 DIAGNOSIS — M1611 Unilateral primary osteoarthritis, right hip: Secondary | ICD-10-CM

## 2023-04-24 MED ORDER — NAPROXEN 375 MG PO TABS: 375.0000 mg | ORAL_TABLET | Freq: Two times a day (BID) | ORAL | 3 refills | Status: DC

## 2023-04-24 MED ORDER — LIDOCAINE HCL 1 % IJ SOLN
4.0000 mL | INTRAMUSCULAR | Status: AC | PRN
  Administered 2023-04-24: 4 mL

## 2023-04-24 MED ORDER — METHYLPREDNISOLONE ACETATE 40 MG/ML IJ SUSP
80.0000 mg | INTRAMUSCULAR | Status: AC | PRN
  Administered 2023-04-24: 80 mg via INTRA_ARTICULAR

## 2023-04-24 NOTE — Progress Notes (Signed)
   Procedure Note  Patient: Douglas Glass             Date of Birth: Sep 21, 1961           MRN: [id number]             Visit Date: 04/24/2023  Procedures: Visit Diagnoses:  1. Primary osteoarthritis of right hip    Large Joint Inj: R hip joint on 04/24/2023 10:06 AM Indications: pain Details: 22 G 3.5 in needle, ultrasound-guided anterior approach Medications: 4 mL lidocaine 1 %; 80 mg methylPREDNISolone acetate 40 MG/ML Outcome: tolerated well, no immediate complications  Procedure: US-guided intra-articular hip injection, Right After discussion on risks/benefits/indications and informed verbal consent was obtained, a timeout was performed. Patient was lying supine on exam table. The hip was cleaned with betadine and alcohol swabs. Then utilizing ultrasound guidance, the patient's femoral head and neck junction was identified and subsequently injected with 4:2 lidocaine:depomedrol via an in-plane approach with ultrasound visualization of the injectate administered into the hip joint. Patient tolerated procedure well without immediate complications.  Procedure, treatment alternatives, risks and benefits explained, specific risks discussed. Consent was given by the patient. Immediately prior to procedure a time out was called to verify the correct patient, procedure, equipment, support staff and site/side marked as required. Patient was prepped and draped in the usual sterile fashion.    - I evaluated the patient about 5 minutes post-injection and he had improvement in pain and range of motion - follow-up with Douglas Glass as indicated; I am happy to see them as needed  Douglas Brunner, DO Primary Care Sports Medicine Physician  [facility]  This note was dictated using Dragon naturally speaking software and may contain errors in syntax, spelling, or content which have not been identified prior to signing this note.

## 2023-04-24 NOTE — Progress Notes (Signed)
Office Visit Note   Patient: Douglas Glass           Date of Birth: 05/11/1961           MRN: [id number] Visit Date: 04/24/2023              Requested by: No referring provider defined for this encounter. PCP: Pcp, No   Assessment & Plan: Visit Diagnoses:  1. Primary osteoarthritis of right hip     Plan: Impression is 62 year old gentleman with bone-on-bone right hip osteoarthritis.  Treatment options were reviewed and explained.  Currently the patient does not have stable housing therefore I do not think it is a good idea to do surgery.  Hopefully a steroid shot will provide him with some relief.  Follow-up as needed.  Follow-Up Instructions: No follow-ups on file.   Orders:  No orders of the defined types were placed in this encounter.  No orders of the defined types were placed in this encounter.     Procedures: No procedures performed   Clinical Data: No additional findings.   Subjective: Chief Complaint  Patient presents with   Right Hip - Follow-up    HPI Patient is a 62 year old gentleman referral from [name] for surgical consultation for right hip replacement.  He has advanced DJD with bone-on-bone changes.  He has severe pain in his right hip and groin.  Uses a cane for ambulation. Review of Systems  Constitutional: Negative.   HENT: Negative.    Eyes: Negative.   Respiratory: Negative.    Cardiovascular: Negative.   Gastrointestinal: Negative.   Endocrine: Negative.   Genitourinary: Negative.   Skin: Negative.   Allergic/Immunologic: Negative.   Neurological: Negative.   Hematological: Negative.   Psychiatric/Behavioral: Negative.    All other systems reviewed and are negative.    Objective: Vital Signs: There were no vitals taken for this visit.  Physical Exam Vitals and nursing note reviewed.  Constitutional:      Appearance: He is well-developed.  HENT:     Head: Normocephalic and atraumatic.  Eyes:     Pupils: Pupils are equal,  round, and reactive to light.  Pulmonary:     Effort: Pulmonary effort is normal.  Abdominal:     Palpations: Abdomen is soft.  Musculoskeletal:        General: Normal range of motion.     Cervical back: Neck supple.  Skin:    General: Skin is warm.  Neurological:     Mental Status: He is alert and oriented to person, place, and time.  Psychiatric:        Behavior: Behavior normal.        Thought Content: Thought content normal.        Judgment: Judgment normal.     Ortho Exam Examination right hip shows considerable groin pain with movement of the hip joint.  Antalgic gait. Specialty Comments:  No specialty comments available.  Imaging: No results found.   PMFS History: Patient Active Problem List   Diagnosis Date Noted   Hypertension 01/24/2019   Chronic pain syndrome 01/24/2019   Benign prostatic hyperplasia with urinary frequency 01/24/2019   Encounter for FIT (fecal immunochemical test) screening 01/24/2019   Chest pain 01/06/2012   MENISCUS TEAR 04/18/2010   MICROALBUMINURIA 09/25/2009   BEN LOC HYPERPLASIA PROS W/O UR OBST & OTH LUTS 09/19/2009   URINARY URGENCY 09/19/2009   HYPERGLYCEMIA 09/19/2009   FOOT PAIN 05/11/2009   URETHRITIS 03/23/2009   TOBACCO ABUSE 09/20/2008  DYSURIA 09/20/2008   CARPAL TUNNEL SYNDROME, BILATERAL 06/21/2008   ALLERGIC RHINITIS 03/27/2008   DENTAL PAIN 03/27/2008   UNSPECIFIED DISORDER OF PENIS 12/31/2007   ORCHIECTOMY, HX OF 10/22/2007   DENTAL CARIES 09/24/2007   BUNIONS, BILATERAL 09/24/2007   Past Medical History:  Diagnosis Date   Back pain    BPH (benign prostatic hyperplasia)    Chronic back pain    Hx of chlamydia infection    Hypercholesteremia    Hypertension    Incidental lung nodule, > 3mm and < 8mm    Prostatitis    Sleep apnea     Family History  Problem Relation Age of Onset   Thyroid disease Mother    Hypertension Mother    Hypertension Other     Past Surgical History:  Procedure Laterality  Date   BUNIONECTOMY     TESTICLE REMOVAL     Social History   Occupational History   Not on file  Tobacco Use   Smoking status: Every Day    Packs/day: 1    Types: Cigarettes   Smokeless tobacco: Never  Substance and Sexual Activity   Alcohol use: Not Currently    Comment: former use   Drug use: Yes    Types: Cocaine, Marijuana    Comment: percocets 2 days ago cocaine   Sexual activity: Not on file

## 2023-05-25 ENCOUNTER — Ambulatory Visit (HOSPITAL_COMMUNITY)
Admission: EM | Admit: 2023-05-25 | Discharge: 2023-05-25 | Disposition: A | Discharge status: Home / Self Care | Payer: Medicaid Other | Attending: Family Medicine | Admitting: Family Medicine

## 2023-05-25 ENCOUNTER — Other Ambulatory Visit: Payer: Self-pay

## 2023-05-25 ENCOUNTER — Encounter (HOSPITAL_COMMUNITY): Payer: Self-pay | Admitting: *Deleted

## 2023-05-25 DIAGNOSIS — A630 Anogenital (venereal) warts: Secondary | ICD-10-CM | POA: Diagnosis not present

## 2023-05-25 MED ORDER — PODOFILOX 0.5 % EX SOLN: CUTANEOUS | 1 refills | Status: AC

## 2023-05-25 NOTE — ED Triage Notes (Signed)
Pt reports he has warts on his penis that need to be freezed off. Pt reports he has had warts on penis before.

## 2023-05-27 NOTE — ED Provider Notes (Signed)
  [facility]   [id number] 05/25/23 Arrival Time: 6295  ASSESSMENT & PLAN:  1. Genital warts    Begin: Meds ordered this encounter  Medications   podofilox (CONDYLOX) 0.5 % external solution    Sig: Apply solution topically every 12 hours for 3 days, then withhold for 4 days; repeat cycle up to 4 times    Dispense:  3.5 mL    Refill:  1   May f/u here as needed.  Reviewed expectations re: course of current medical issues. Questions answered. Outlined signs and symptoms indicating need for more acute intervention. Patient verbalized understanding. After Visit Summary given.   SUBJECTIVE:  Douglas Glass is a 62 y.o. male who reports he has warts on his penis that need to be freezed off. H/O similar. Otherwise well.  OBJECTIVE:  Vitals:   05/25/23 1030  BP: (!) 148/87  Pulse: 75  Resp: 18  Temp: 98.5 F (36.9 C)  SpO2: 95%    General appearance: alert, cooperative, appears stated age and no distress Abdomen: soft, non-tender GU: small genital wards on glans penis Skin: warm and dry Psychological: alert and cooperative; normal mood and affect.  No Known Allergies  Past Medical History:  Diagnosis Date   Back pain    BPH (benign prostatic hyperplasia)    Chronic back pain    Hx of chlamydia infection    Hypercholesteremia    Hypertension    Incidental lung nodule, > 3mm and < 8mm    Prostatitis    Sleep apnea    Family History  Problem Relation Age of Onset   Thyroid disease Mother    Hypertension Mother    Hypertension Other    Social History   Socioeconomic History   Marital status: Divorced    Spouse name: Not on file   Number of children: Not on file   Years of education: Not on file   Highest education level: Not on file  Occupational History   Not on file  Tobacco Use   Smoking status: Every Day    Packs/day: 1    Types: Cigarettes   Smokeless tobacco: Never  Substance and Sexual Activity   Alcohol use: Not Currently     Comment: former use   Drug use: Yes    Types: Cocaine, Marijuana    Comment: percocets 2 days ago cocaine   Sexual activity: Not on file  Other Topics Concern   Not on file  Social History Narrative   ** Merged History Encounter **       ** Merged History Encounter **       Social Determinants of Health   Financial Resource Strain: Not on file  Food Insecurity: Not on file  Transportation Needs: Not on file  Physical Activity: Not on file  Stress: Not on file  Social Connections: Not on file  Intimate Partner Violence: Not on file           [place], MD 05/27/23 1317

## 2023-06-07 ENCOUNTER — Encounter (HOSPITAL_COMMUNITY): Payer: Self-pay

## 2023-06-07 ENCOUNTER — Emergency Department (HOSPITAL_COMMUNITY): Payer: Medicaid Other

## 2023-06-07 ENCOUNTER — Emergency Department (HOSPITAL_COMMUNITY)
Admission: EM | Admit: 2023-06-07 | Discharge: 2023-06-07 | Disposition: A | Discharge status: Home / Self Care | Payer: Medicaid Other | Attending: Emergency Medicine | Admitting: Emergency Medicine

## 2023-06-07 ENCOUNTER — Other Ambulatory Visit: Payer: Self-pay

## 2023-06-07 DIAGNOSIS — W01198A Fall on same level from slipping, tripping and stumbling with subsequent striking against other object, initial encounter: Secondary | ICD-10-CM | POA: Insufficient documentation

## 2023-06-07 DIAGNOSIS — S0990XA Unspecified injury of head, initial encounter: Secondary | ICD-10-CM | POA: Diagnosis present

## 2023-06-07 DIAGNOSIS — Z5900 Homelessness unspecified: Secondary | ICD-10-CM | POA: Insufficient documentation

## 2023-06-07 DIAGNOSIS — I10 Essential (primary) hypertension: Secondary | ICD-10-CM | POA: Insufficient documentation

## 2023-06-07 DIAGNOSIS — Z79899 Other long term (current) drug therapy: Secondary | ICD-10-CM | POA: Diagnosis not present

## 2023-06-07 NOTE — ED Provider Notes (Signed)
[place] EMERGENCY DEPARTMENT AT [facility] Provider Note   CSN: [id number] Arrival date & time: 06/07/23  2017     History {Add pertinent medical, surgical, social history, OB history to HPI:1} Chief Complaint  Patient presents with   Douglas Glass is a 62 y.o. male.  62 year old male, presently homeless, presents to the emergency department for evaluation of head injury.  He states that he tripped on a handicap accessible component of the bus causing him to fall forward and strike his head.  He denies loss of consciousness.  Doesn't feel quite like himself; feels like the incident has made him "smarter". Denies vision changes, N/V, extremity weakness. Not on chronic anticoagulation.  The history is provided by the patient. No language interpreter was used.  Fall      Home Medications Prior to Admission medications   Medication Sig Start Date End Date Taking? Authorizing Provider  allopurinol (ZYLOPRIM) 100 MG tablet Take 100 mg by mouth daily. 02/06/21   [provider]  amLODipine (NORVASC) 10 MG tablet Take 1 tablet (10 mg total) by mouth daily. 01/24/19   Grayce Sessions, NP  gabapentin (NEURONTIN) 300 MG capsule Take 300 mg by mouth 3 (three) times daily. 02/19/21   [provider]  Ivermectin 0.5 % LOTN Apply to skin and leave in place for 10 minutes then shower 07/19/22   Molpus, John, MD  lidocaine (LIDODERM) 5 % Place 1 patch onto the skin daily. Remove & Discard patch within 12 hours or as directed by MD (right hip region) 04/14/23   Linwood Dibbles, MD  lisinopril-hydrochlorothiazide (ZESTORETIC) 20-12.5 MG tablet Take 2 tablets by mouth daily. 01/24/19   Grayce Sessions, NP  meloxicam (MOBIC) 15 MG tablet Take 15 mg by mouth daily. 02/19/21   [provider]  methocarbamol (ROBAXIN) 500 MG tablet Take 1 tablet (500 mg total) by mouth 3 (three) times daily as needed for muscle spasms. 12/16/22   Harris, Cammy Copa, PA-C  naproxen  (NAPROSYN) 375 MG tablet Take 1 tablet (375 mg total) by mouth 2 (two) times daily. 04/24/23   Tarry Kos, MD  podofilox (CONDYLOX) 0.5 % external solution Apply solution topically every 12 hours for 3 days, then withhold for 4 days; repeat cycle up to 4 times 05/25/23   Mardella Layman, MD  SUBOXONE 8-2 MG FILM Place under the tongue 2 (two) times daily. 02/06/21   [provider]  tamsulosin (FLOMAX) 0.4 MG CAPS capsule Take 1 capsule (0.4 mg total) by mouth daily. 01/24/19   Grayce Sessions, NP      Allergies    Patient has no known allergies.    Review of Systems   Review of Systems Ten systems reviewed and are negative for acute change, except as noted in the HPI.    Physical Exam Updated Vital Signs BP (!) 154/83   Pulse 62   Temp 98.4 F (36.9 C) (Oral)   Resp 14   Ht 5\' 7"  (1.702 m)   Wt 104.3 kg   SpO2 97%   BMI 36.01 kg/m   Physical Exam Vitals and nursing note reviewed.  Constitutional:      General: He is not in acute distress.    Appearance: He is well-developed. He is not diaphoretic.     Comments: Alert and appropriate for age. Nontoxic appearing.  HENT:     Head: Normocephalic and atraumatic.     Comments: Punctate abrasion to the L parietal scalp.  No battle's sign or raccoon's eyes.  No skull instability.    Ears:     Comments: No hemotympanum bilaterally Eyes:     General: No scleral icterus.    Conjunctiva/sclera: Conjunctivae normal.  Neck:     Comments: No meningismus Pulmonary:     Effort: Pulmonary effort is normal. No respiratory distress.     Comments: Respirations even and unlabored Musculoskeletal:        General: Normal range of motion.     Cervical back: Normal range of motion.  Skin:    General: Skin is warm and dry.     Coloration: Skin is not pale.     Findings: No erythema or rash.  Neurological:     Mental Status: He is alert and oriented to person, place, and time.     Comments: GCS 15. Speech is goal oriented. No  deficits appreciated to CN III-XII; symmetric eyebrow raise, no facial drooping, tongue midline. Patient has equal grip strength bilaterally with 5/5 strength against resistance in all major muscle groups bilaterally. Sensation to light touch intact. Patient moves extremities without ataxia.   Psychiatric:        Behavior: Behavior normal.     ED Results / Procedures / Treatments   Labs (all labs ordered are listed, but only abnormal results are displayed) Labs Reviewed - No data to display  EKG None  Radiology No results found.  Procedures Procedures  {Document cardiac monitor, telemetry assessment procedure when appropriate:1}  Medications Ordered in ED Medications - No data to display  ED Course/ Medical Decision Making/ A&P   {   Click here for ABCD2, HEART and other calculatorsREFRESH Note before signing :1}                          Medical Decision Making Amount and/or Complexity of Data Reviewed Radiology: ordered.   ***  {Document critical care time when appropriate:1} {Document review of labs and clinical decision tools ie heart score, Chads2Vasc2 etc:1}  {Document your independent review of radiology images, and any outside records:1} {Document your discussion with family members, caretakers, and with consultants:1} {Document social determinants of health affecting pt's care:1} {Document your decision making why or why not admission, treatments were needed:1} Final Clinical Impression(s) / ED Diagnoses Final diagnoses:  None    Rx / DC Orders ED Discharge Orders     None

## 2023-06-07 NOTE — ED Notes (Signed)
Patient ambulatory with steady gait states I am hungry I am leaving to go get food patient redirected back to room and advised we would get him a sandwich

## 2023-06-07 NOTE — ED Notes (Signed)
Patient arrived to room via wc changed into gown placed into bed. Patient states he fell this afternoon trying to get onbus and hit his head. Patient states he did not pass out and was able to walk to laundry and do his clothes. Patient is homeless wants to eat says he has a concussion and want to leave here. Patient a/o x 4 respirations even and non labored no visible hematoma to area he states he injured. Patient has orange soda and chips at bedside.

## 2023-06-07 NOTE — Discharge Instructions (Signed)
Your head CT was reassuring and did not show any skull fracture or bleeding in your brain.  We recommend follow-up with a primary care doctor as needed.  Take Tylenol or ibuprofen for any residual headaches.

## 2023-06-07 NOTE — ED Triage Notes (Signed)
Pt arrived via POV s/p fall getting on to bus. Pt states that he tripped over his own feet and hit the front left side of forehead. 10/10 pain. Denies LOC.

## 2023-08-12 ENCOUNTER — Encounter (HOSPITAL_COMMUNITY): Payer: Self-pay | Admitting: Emergency Medicine

## 2023-08-12 ENCOUNTER — Emergency Department (HOSPITAL_COMMUNITY): Payer: Medicaid Other

## 2023-08-12 ENCOUNTER — Other Ambulatory Visit: Payer: Self-pay

## 2023-08-12 ENCOUNTER — Emergency Department (HOSPITAL_COMMUNITY)
Admission: EM | Admit: 2023-08-12 | Discharge: 2023-08-12 | Disposition: A | Discharge status: Home / Self Care | Payer: Medicaid Other | Attending: Emergency Medicine | Admitting: Emergency Medicine

## 2023-08-12 DIAGNOSIS — Z79899 Other long term (current) drug therapy: Secondary | ICD-10-CM | POA: Insufficient documentation

## 2023-08-12 DIAGNOSIS — M7989 Other specified soft tissue disorders: Secondary | ICD-10-CM | POA: Diagnosis present

## 2023-08-12 DIAGNOSIS — L03012 Cellulitis of left finger: Secondary | ICD-10-CM | POA: Insufficient documentation

## 2023-08-12 MED ORDER — CEPHALEXIN 500 MG PO CAPS: 500.0000 mg | ORAL_CAPSULE | Freq: Four times a day (QID) | ORAL | 0 refills | Status: AC

## 2023-08-12 MED ORDER — LIDOCAINE HCL (PF) 1 % IJ SOLN
30.0000 mL | Freq: Once | INTRAMUSCULAR | Status: AC
  Administered 2023-08-12: 30 mL via INTRADERMAL
  Filled 2023-08-12: qty 30

## 2023-08-12 NOTE — ED Triage Notes (Signed)
Patient with swelling and pain to the middle finger on the left hand for 4 days now. States "something is in there but I didn't see what it was". Denies any trauma to the finger itself.

## 2023-08-12 NOTE — Discharge Instructions (Signed)
Take the antibiotic as prescribed. See your doctor for recheck if symptoms do not resolve over the next week.

## 2023-08-12 NOTE — ED Provider Notes (Signed)
[place] EMERGENCY DEPARTMENT AT [facility] Provider Note   CSN: [id number] Arrival date & time: 08/12/23  0319     History  Chief Complaint  Patient presents with   finger swelling    Rodner Kummer is a 62 y.o. male.  Patient to ED for evaluation of pain and swelling to the left middle finger that started 4 days ago. No drainage or wound. He denies know injury.   The history is provided by the patient. No language interpreter was used.       Home Medications Prior to Admission medications   Medication Sig Start Date End Date Taking? Authorizing Provider  cephALEXin (KEFLEX) 500 MG capsule Take 1 capsule (500 mg total) by mouth 4 (four) times daily. 08/12/23  Yes Elpidio Anis, PA-C  allopurinol (ZYLOPRIM) 100 MG tablet Take 100 mg by mouth daily. 02/06/21   [provider]  amLODipine (NORVASC) 10 MG tablet Take 1 tablet (10 mg total) by mouth daily. 01/24/19   Grayce Sessions, NP  gabapentin (NEURONTIN) 300 MG capsule Take 300 mg by mouth 3 (three) times daily. 02/19/21   [provider]  Ivermectin 0.5 % LOTN Apply to skin and leave in place for 10 minutes then shower 07/19/22   Molpus, John, MD  lidocaine (LIDODERM) 5 % Place 1 patch onto the skin daily. Remove & Discard patch within 12 hours or as directed by MD (right hip region) 04/14/23   Linwood Dibbles, MD  lisinopril-hydrochlorothiazide (ZESTORETIC) 20-12.5 MG tablet Take 2 tablets by mouth daily. 01/24/19   Grayce Sessions, NP  meloxicam (MOBIC) 15 MG tablet Take 15 mg by mouth daily. 02/19/21   [provider]  methocarbamol (ROBAXIN) 500 MG tablet Take 1 tablet (500 mg total) by mouth 3 (three) times daily as needed for muscle spasms. 12/16/22   Harris, Cammy Copa, PA-C  naproxen (NAPROSYN) 375 MG tablet Take 1 tablet (375 mg total) by mouth 2 (two) times daily. 04/24/23   Tarry Kos, MD  podofilox (CONDYLOX) 0.5 % external solution Apply solution topically every 12 hours for 3 days,  then withhold for 4 days; repeat cycle up to 4 times 05/25/23   Mardella Layman, MD  SUBOXONE 8-2 MG FILM Place under the tongue 2 (two) times daily. 02/06/21   [provider]  tamsulosin (FLOMAX) 0.4 MG CAPS capsule Take 1 capsule (0.4 mg total) by mouth daily. 01/24/19   Grayce Sessions, NP      Allergies    Patient has no known allergies.    Review of Systems   Review of Systems  Physical Exam Updated Vital Signs BP (!) 157/98 (BP Location: Right Arm)   Pulse 77   Temp 97.7 F (36.5 C) (Oral)   Resp 17   Ht 5\' 7"  (1.702 m)   Wt 104 kg   SpO2 100%   BMI 35.91 kg/m  Physical Exam Vitals and nursing note reviewed.  Musculoskeletal:        General: Normal range of motion.     Comments: Swelling and fluctuance of the left middle finger cuticle c/w paronychia.      ED Results / Procedures / Treatments   Labs (all labs ordered are listed, but only abnormal results are displayed) Labs Reviewed - No data to display  EKG None  Radiology DG Finger Middle Left  Result Date: 08/12/2023 CLINICAL DATA:  Finger swelling x4 days, thinks there may be a piece of glass in his finger. EXAM: LEFT MIDDLE FINGER  2+V COMPARISON:  None Available. FINDINGS: There is no evidence of fracture or dislocation. There is no evidence of severe arthropathy or other focal bone abnormality. Volar subcutaneus soft tissue edema of the third digit. No retained radiopaque foreign body. IMPRESSION: 1. No acute displaced fracture or dislocation. 2. No retained radiopaque foreign body. Electronically Signed   By: Tish Frederickson M.D.   On: 08/12/2023 03:59    Procedures Drain paronychia  Date/Time: 08/12/2023 6:20 AM  Performed by: Elpidio Anis, PA-C Authorized by: Elpidio Anis, PA-C  Consent: Verbal consent obtained. Consent given by: patient Patient understanding: patient states understanding of the procedure being performed Imaging studies: imaging studies available Patient identity  confirmed: verbally with patient Local anesthesia used: yes Anesthesia: digital block  Anesthesia: Local anesthesia used: yes Local Anesthetic: lidocaine 1% without epinephrine Anesthetic total: 6 mL  Sedation: Patient sedated: no  Patient tolerance: patient tolerated the procedure well with no immediate complications       Medications Ordered in ED Medications  lidocaine (PF) (XYLOCAINE) 1 % injection 30 mL (30 mLs Intradermal Given by Other 08/12/23 0542)    ED Course/ Medical Decision Making/ A&P Clinical Course as of 08/12/23 0622  Wed Aug 12, 2023  0621 Presents with left 3rd finger paronychia that was successfully drained. Keflex x 5 days. Follow up care provided.  [SU]    Clinical Course User Index [SU] Elpidio Anis, PA-C                                 Medical Decision Making Amount and/or Complexity of Data Reviewed Radiology: ordered.           Final Clinical Impression(s) / ED Diagnoses Final diagnoses:  Paronychia of finger of left hand    Rx / DC Orders ED Discharge Orders          Ordered    cephALEXin (KEFLEX) 500 MG capsule  4 times daily        08/12/23 0615              Elpidio Anis, PA-C 08/12/23 0622    Sabas Sous, MD 08/12/23 [phone]

## 2023-09-03 ENCOUNTER — Emergency Department (HOSPITAL_COMMUNITY)
Admission: EM | Admit: 2023-09-03 | Discharge: 2023-09-03 | Disposition: A | Discharge status: Home / Self Care | Payer: Medicaid Other | Attending: Emergency Medicine | Admitting: Emergency Medicine

## 2023-09-03 ENCOUNTER — Other Ambulatory Visit: Payer: Self-pay

## 2023-09-03 DIAGNOSIS — Z79899 Other long term (current) drug therapy: Secondary | ICD-10-CM | POA: Diagnosis not present

## 2023-09-03 DIAGNOSIS — I10 Essential (primary) hypertension: Secondary | ICD-10-CM | POA: Insufficient documentation

## 2023-09-03 DIAGNOSIS — K0889 Other specified disorders of teeth and supporting structures: Secondary | ICD-10-CM | POA: Insufficient documentation

## 2023-09-03 MED ORDER — PENICILLIN V POTASSIUM 500 MG PO TABS: 500.0000 mg | ORAL_TABLET | Freq: Four times a day (QID) | ORAL | 0 refills | Status: AC

## 2023-09-03 MED ORDER — NAPROXEN 500 MG PO TABS: 500.0000 mg | ORAL_TABLET | Freq: Two times a day (BID) | ORAL | 0 refills | Status: AC

## 2023-09-03 MED ORDER — PENICILLIN V POTASSIUM 250 MG PO TABS
500.0000 mg | ORAL_TABLET | Freq: Once | ORAL | Status: AC
  Administered 2023-09-03: 500 mg via ORAL
  Filled 2023-09-03: qty 2

## 2023-09-03 MED ORDER — NAPROXEN 250 MG PO TABS
500.0000 mg | ORAL_TABLET | Freq: Once | ORAL | Status: AC
  Administered 2023-09-03: 500 mg via ORAL
  Filled 2023-09-03: qty 2

## 2023-09-03 NOTE — ED Provider Notes (Signed)
[facility] Provider Note   CSN: [id number] Arrival date & time: 09/03/23  0046     History  Chief Complaint  Patient presents with   Dental Pain    Douglas Glass is a 62 y.o. male.  The history is provided by the patient and medical records.  Dental Pain  62 year old male with history of chronic back pain, hypertension, BPH, presenting to the ED for dental pain.  Ongoing for a "long time" but got really bad tonight.  Denies any trouble swallowing.  He does not have a dentist.  He takes suboxone and is in pain management.  Home Medications Prior to Admission medications   Medication Sig Start Date End Date Taking? Authorizing Provider  naproxen (NAPROSYN) 500 MG tablet Take 1 tablet (500 mg total) by mouth 2 (two) times daily. 09/03/23  Yes Garlon Hatchet, PA-C  penicillin v potassium (VEETID) 500 MG tablet Take 1 tablet (500 mg total) by mouth 4 (four) times daily for 10 days. 09/03/23 09/13/23 Yes Garlon Hatchet, PA-C  allopurinol (ZYLOPRIM) 100 MG tablet Take 100 mg by mouth daily. 02/06/21   [provider]  amLODipine (NORVASC) 10 MG tablet Take 1 tablet (10 mg total) by mouth daily. 01/24/19   Grayce Sessions, NP  cephALEXin (KEFLEX) 500 MG capsule Take 1 capsule (500 mg total) by mouth 4 (four) times daily. 08/12/23   Elpidio Anis, PA-C  gabapentin (NEURONTIN) 300 MG capsule Take 300 mg by mouth 3 (three) times daily. 02/19/21   [provider]  Ivermectin 0.5 % LOTN Apply to skin and leave in place for 10 minutes then shower 07/19/22   Molpus, John, MD  lidocaine (LIDODERM) 5 % Place 1 patch onto the skin daily. Remove & Discard patch within 12 hours or as directed by MD (right hip region) 04/14/23   Linwood Dibbles, MD  lisinopril-hydrochlorothiazide (ZESTORETIC) 20-12.5 MG tablet Take 2 tablets by mouth daily. 01/24/19   Grayce Sessions, NP  meloxicam (MOBIC) 15 MG tablet Take 15 mg by mouth daily. 02/19/21    [provider]  methocarbamol (ROBAXIN) 500 MG tablet Take 1 tablet (500 mg total) by mouth 3 (three) times daily as needed for muscle spasms. 12/16/22   Harris, Abigail, PA-C  podofilox (CONDYLOX) 0.5 % external solution Apply solution topically every 12 hours for 3 days, then withhold for 4 days; repeat cycle up to 4 times 05/25/23   Mardella Layman, MD  SUBOXONE 8-2 MG FILM Place under the tongue 2 (two) times daily. 02/06/21   [provider]  tamsulosin (FLOMAX) 0.4 MG CAPS capsule Take 1 capsule (0.4 mg total) by mouth daily. 01/24/19   Grayce Sessions, NP      Allergies    Patient has no known allergies.    Review of Systems   Review of Systems  HENT:  Positive for dental problem.   All other systems reviewed and are negative.   Physical Exam Updated Vital Signs BP (!) 141/89   Pulse 83   Temp 98.8 F (37.1 C) (Oral)   Resp 18   Ht 5\' 7"  (1.702 m)   Wt 104.3 kg   SpO2 97%   BMI 36.02 kg/m   Physical Exam Vitals and nursing note reviewed.  Constitutional:      Appearance: He is well-developed.     Comments: Eating cheetos  HENT:     Head: Normocephalic and atraumatic.     Mouth/Throat:  Comments: Teeth largely in poor dentition, multiple teeth broken and decayed including left upper and lower lateral incisors, surrounding gingiva inflamed without discrete fluid collection, handling secretions appropriately (actually eating during exam), no trismus, no facial or neck swelling, normal phonation without stridor Food debris noted in the mouth/teeth Eyes:     Conjunctiva/sclera: Conjunctivae normal.     Pupils: Pupils are equal, round, and reactive to light.  Cardiovascular:     Rate and Rhythm: Normal rate and regular rhythm.     Heart sounds: Normal heart sounds.  Pulmonary:     Effort: Pulmonary effort is normal.     Breath sounds: Normal breath sounds.  Abdominal:     General: Bowel sounds are normal.     Palpations: Abdomen is soft.   Musculoskeletal:        General: Normal range of motion.     Cervical back: Normal range of motion.  Skin:    General: Skin is warm and dry.  Neurological:     Mental Status: He is alert and oriented to person, place, and time.     ED Results / Procedures / Treatments   Labs (all labs ordered are listed, but only abnormal results are displayed) Labs Reviewed - No data to display  EKG None  Radiology No results found.  Procedures Procedures    Medications Ordered in ED Medications  penicillin v potassium (VEETID) tablet 500 mg (500 mg Oral Given 09/03/23 0146)  naproxen (NAPROSYN) tablet 500 mg (500 mg Oral Given 09/03/23 0146)    ED Course/ Medical Decision Making/ A&P                                 Medical Decision Making Risk Prescription drug management.   62 year old male here with dental pain.  Has multiple caries and broken teeth on exam.  He does not have any discrete fluid collection or signs of dental abscess.  No airway compromise.  No clinical signs or symptoms concerning for Ludwig's angina.  Will start on antibiotics and referred to dentistry.  He requested pain medication, however it appears he is already in pain management and is on Suboxone.  Will defer to his pain management doctor should he need adjustments.  He can return here for new concerns.  Final Clinical Impression(s) / ED Diagnoses Final diagnoses:  Pain, dental    Rx / DC Orders ED Discharge Orders          Ordered    naproxen (NAPROSYN) 500 MG tablet  2 times daily        09/03/23 0206    penicillin v potassium (VEETID) 500 MG tablet  4 times daily        09/03/23 0206              Garlon Hatchet, PA-C 09/03/23 0350    Palumbo, April, MD 09/03/23 [phone]

## 2023-09-03 NOTE — Discharge Instructions (Signed)
Take the prescribed medication as directed. Follow-up with dentist as soon as you can.  Call for appt. Return to the ED for new or worsening symptoms.

## 2023-09-03 NOTE — ED Triage Notes (Signed)
Pt arrives to ED c/o top and lower/ front dental pain. Pt reports that pain has been present for months

## 2023-09-30 ENCOUNTER — Emergency Department (HOSPITAL_COMMUNITY)
Admission: EM | Admit: 2023-09-30 | Discharge: 2023-09-30 | Disposition: A | Discharge status: Home / Self Care | Payer: Medicaid Other | Attending: Emergency Medicine | Admitting: Emergency Medicine

## 2023-09-30 ENCOUNTER — Other Ambulatory Visit: Payer: Self-pay

## 2023-09-30 ENCOUNTER — Encounter (HOSPITAL_COMMUNITY): Payer: Self-pay

## 2023-09-30 DIAGNOSIS — R209 Unspecified disturbances of skin sensation: Secondary | ICD-10-CM | POA: Insufficient documentation

## 2023-09-30 DIAGNOSIS — M7989 Other specified soft tissue disorders: Secondary | ICD-10-CM | POA: Diagnosis not present

## 2023-09-30 DIAGNOSIS — M79642 Pain in left hand: Secondary | ICD-10-CM | POA: Insufficient documentation

## 2023-09-30 MED ORDER — DOXYCYCLINE HYCLATE 100 MG PO CAPS: 100.0000 mg | ORAL_CAPSULE | Freq: Two times a day (BID) | ORAL | 0 refills | Status: AC

## 2023-09-30 MED ORDER — DOXYCYCLINE HYCLATE 100 MG PO TABS
100.0000 mg | ORAL_TABLET | Freq: Once | ORAL | Status: AC
  Administered 2023-09-30: 100 mg via ORAL
  Filled 2023-09-30: qty 1

## 2023-09-30 NOTE — ED Provider Notes (Signed)
  [facility]PT [facility] Emergency Department Provider Note MRN:  [id number]  Arrival date & time: 09/30/23     Chief Complaint   Hand Pain   History of Present Illness   Douglas Glass is a 62 y.o. year-old male presents to the ED with chief complaint of left middle finger pain.  He had the same finger drained for paronychia about 6 weeks ago.  States that he had it drained, but states that he still has some pain and numbness in the finger.  He denies fever or chills.  States that he took that antibiotic that was previously prescribed.  History provided by patient.   Review of Systems  Pertinent positive and negative review of systems noted in HPI.    Physical Exam   Vitals:   09/30/23 0459  BP: 121/76  Pulse: 64  Resp: 16  Temp: 97.7 F (36.5 C)  SpO2: 97%    CONSTITUTIONAL:  well-appearing, NAD NEURO:  Alert and oriented x 3, CN 3-12 grossly intact EYES:  eyes equal and reactive ENT/NECK:  Supple, no stridor  CARDIO:  appears well-perfused  PULM:  No respiratory distress,  GI/GU:  non-distended,  MSK/SPINE:  No gross deformities, no edema, moves all extremities  SKIN:  very mild swelling of the left middle finger tip, but no fluctuance, no apparent drainable abscess   *Additional and/or pertinent findings included in MDM below  Diagnostic and Interventional Summary    EKG Interpretation Date/Time:    Ventricular Rate:    PR Interval:    QRS Duration:    QT Interval:    QTC Calculation:   R Axis:      Text Interpretation:         Labs Reviewed - No data to display  No orders to display    Medications  doxycycline (VIBRA-TABS) tablet 100 mg (100 mg Oral Given 09/30/23 0543)     Procedures  /  Critical Care Procedures  ED Course and Medical Decision Making  I have reviewed the triage vital signs, the nursing notes, and pertinent available records from the EMR.  Social Determinants Affecting Complexity of Care: Patient has no  clinically significant social determinants affecting this chief complaint..   ED Course:    Medical Decision Making Risk Prescription drug management.         Consultants: No consultations were needed in caring for this patient.   Treatment and Plan: Emergency department workup does not suggest an emergent condition requiring admission or immediate intervention beyond  what has been performed at this time. The patient is safe for discharge and has  been instructed to return immediately for worsening symptoms, change in  symptoms or any other concerns    Final Clinical Impressions(s) / ED Diagnoses     ICD-10-CM   1. Finger swelling  M79.89       ED Discharge Orders          Ordered    doxycycline (VIBRAMYCIN) 100 MG capsule  2 times daily        09/30/23 0554              Discharge Instructions Discussed with and Provided to Patient:     Discharge Instructions      Please take the antibiotics as prescribed.  Follow-up with the doctor listed.  Return for new or worsening symptoms.       Roxy Horseman, PA-C 09/30/23 0555    Sabas Sous, MD 09/30/23 [phone]

## 2023-09-30 NOTE — ED Triage Notes (Signed)
Subjective swelling and drainage of left middle finger.   Says he was seen last month for same and given abx. Says finger has started swelling again.   Denies injury.

## 2023-09-30 NOTE — Discharge Instructions (Signed)
Please take the antibiotics as prescribed.  Follow-up with the doctor listed.  Return for new or worsening symptoms.
